# Patient Record
Sex: Female | Born: 1955 | Race: White | Hispanic: No | Marital: Married | Smoking: Never smoker
Health system: Southern US, Community
[De-identification: ages and names within clinical notes are randomized; demographics above are authoritative.]

## PROBLEM LIST (undated history)

## (undated) DIAGNOSIS — I10 Essential (primary) hypertension: Secondary | ICD-10-CM

## (undated) DIAGNOSIS — E039 Hypothyroidism, unspecified: Secondary | ICD-10-CM

## (undated) DIAGNOSIS — K219 Gastro-esophageal reflux disease without esophagitis: Secondary | ICD-10-CM

## (undated) DIAGNOSIS — E785 Hyperlipidemia, unspecified: Secondary | ICD-10-CM

## (undated) DIAGNOSIS — Z8616 Personal history of COVID-19: Secondary | ICD-10-CM

## (undated) DIAGNOSIS — H269 Unspecified cataract: Secondary | ICD-10-CM

## (undated) DIAGNOSIS — M810 Age-related osteoporosis without current pathological fracture: Secondary | ICD-10-CM

## (undated) DIAGNOSIS — T7840XA Allergy, unspecified, initial encounter: Secondary | ICD-10-CM

## (undated) DIAGNOSIS — J45909 Unspecified asthma, uncomplicated: Secondary | ICD-10-CM

## (undated) DIAGNOSIS — R0981 Nasal congestion: Secondary | ICD-10-CM

## (undated) HISTORY — DX: Essential (primary) hypertension: I10

## (undated) HISTORY — DX: Nasal congestion: R09.81

## (undated) HISTORY — PX: OTHER SURGICAL HISTORY: SHX169

## (undated) HISTORY — DX: Hyperlipidemia, unspecified: E78.5

## (undated) HISTORY — PX: CHOLECYSTECTOMY: SHX55

## (undated) HISTORY — DX: Personal history of COVID-19: Z86.16

## (undated) HISTORY — PX: GALLBLADDER SURGERY: SHX652

## (undated) HISTORY — DX: Unspecified cataract: H26.9

## (undated) HISTORY — DX: Age-related osteoporosis without current pathological fracture: M81.0

## (undated) HISTORY — PX: ABDOMINAL HYSTERECTOMY: SHX81

## (undated) HISTORY — PX: LAPAROSCOPIC HYSTERECTOMY: SHX1926

## (undated) HISTORY — DX: Allergy, unspecified, initial encounter: T78.40XA

## (undated) HISTORY — PX: DUPUYTREN CONTRACTURE RELEASE: SHX1478

---

## 1997-05-23 ENCOUNTER — Emergency Department (HOSPITAL_COMMUNITY): Admission: RE | Admit: 1997-05-23 | Discharge: 1997-05-23 | Payer: Self-pay | Admitting: Emergency Medicine

## 1997-06-18 ENCOUNTER — Encounter (HOSPITAL_COMMUNITY): Admission: RE | Admit: 1997-06-18 | Discharge: 1997-09-16 | Payer: Self-pay | Admitting: *Deleted

## 1997-09-16 ENCOUNTER — Other Ambulatory Visit: Admission: RE | Admit: 1997-09-16 | Discharge: 1997-09-16 | Payer: Self-pay | Admitting: Obstetrics and Gynecology

## 1997-10-27 ENCOUNTER — Ambulatory Visit (HOSPITAL_COMMUNITY): Admission: RE | Admit: 1997-10-27 | Discharge: 1997-10-27 | Payer: Self-pay | Admitting: Obstetrics and Gynecology

## 1997-11-24 ENCOUNTER — Encounter: Admission: RE | Admit: 1997-11-24 | Discharge: 1998-02-22 | Payer: Self-pay | Admitting: *Deleted

## 1998-05-10 ENCOUNTER — Emergency Department (HOSPITAL_COMMUNITY): Admission: EM | Admit: 1998-05-10 | Discharge: 1998-05-10 | Payer: Self-pay

## 1998-05-10 ENCOUNTER — Encounter: Payer: Self-pay | Admitting: Emergency Medicine

## 1998-05-14 ENCOUNTER — Ambulatory Visit (HOSPITAL_COMMUNITY): Admission: RE | Admit: 1998-05-14 | Discharge: 1998-05-14 | Payer: Self-pay | Admitting: Internal Medicine

## 1998-05-14 ENCOUNTER — Encounter: Payer: Self-pay | Admitting: Internal Medicine

## 1998-09-01 ENCOUNTER — Other Ambulatory Visit: Admission: RE | Admit: 1998-09-01 | Discharge: 1998-09-01 | Payer: Self-pay | Admitting: Obstetrics and Gynecology

## 1998-10-06 ENCOUNTER — Encounter (INDEPENDENT_AMBULATORY_CARE_PROVIDER_SITE_OTHER): Payer: Self-pay | Admitting: Specialist

## 1998-10-06 ENCOUNTER — Inpatient Hospital Stay (HOSPITAL_COMMUNITY): Admission: RE | Admit: 1998-10-06 | Discharge: 1998-10-08 | Payer: Self-pay | Admitting: Gynecology

## 1999-03-22 ENCOUNTER — Emergency Department (HOSPITAL_COMMUNITY): Admission: EM | Admit: 1999-03-22 | Discharge: 1999-03-22 | Payer: Self-pay | Admitting: Emergency Medicine

## 1999-03-22 ENCOUNTER — Encounter: Payer: Self-pay | Admitting: Emergency Medicine

## 1999-10-25 ENCOUNTER — Other Ambulatory Visit: Admission: RE | Admit: 1999-10-25 | Discharge: 1999-10-25 | Payer: Self-pay | Admitting: Obstetrics and Gynecology

## 2000-02-29 ENCOUNTER — Encounter (INDEPENDENT_AMBULATORY_CARE_PROVIDER_SITE_OTHER): Payer: Self-pay | Admitting: *Deleted

## 2000-02-29 ENCOUNTER — Ambulatory Visit (HOSPITAL_COMMUNITY): Admission: RE | Admit: 2000-02-29 | Discharge: 2000-02-29 | Payer: Self-pay | Admitting: Internal Medicine

## 2000-02-29 ENCOUNTER — Encounter: Payer: Self-pay | Admitting: Internal Medicine

## 2000-03-02 ENCOUNTER — Encounter: Payer: Self-pay | Admitting: Internal Medicine

## 2000-05-22 ENCOUNTER — Encounter: Payer: Self-pay | Admitting: General Surgery

## 2000-05-22 ENCOUNTER — Ambulatory Visit (HOSPITAL_COMMUNITY): Admission: RE | Admit: 2000-05-22 | Discharge: 2000-05-22 | Payer: Self-pay | Admitting: General Surgery

## 2000-07-22 ENCOUNTER — Encounter (INDEPENDENT_AMBULATORY_CARE_PROVIDER_SITE_OTHER): Payer: Self-pay | Admitting: *Deleted

## 2000-08-15 ENCOUNTER — Encounter: Payer: Self-pay | Admitting: General Surgery

## 2000-08-17 ENCOUNTER — Observation Stay (HOSPITAL_COMMUNITY): Admission: RE | Admit: 2000-08-17 | Discharge: 2000-08-18 | Payer: Self-pay | Admitting: General Surgery

## 2000-08-17 ENCOUNTER — Encounter (INDEPENDENT_AMBULATORY_CARE_PROVIDER_SITE_OTHER): Payer: Self-pay | Admitting: Specialist

## 2000-08-17 ENCOUNTER — Encounter (INDEPENDENT_AMBULATORY_CARE_PROVIDER_SITE_OTHER): Payer: Self-pay | Admitting: *Deleted

## 2000-08-17 ENCOUNTER — Encounter: Payer: Self-pay | Admitting: General Surgery

## 2000-10-24 ENCOUNTER — Other Ambulatory Visit: Admission: RE | Admit: 2000-10-24 | Discharge: 2000-10-24 | Payer: Self-pay | Admitting: Obstetrics and Gynecology

## 2001-10-09 ENCOUNTER — Encounter: Payer: Self-pay | Admitting: Internal Medicine

## 2001-10-14 ENCOUNTER — Encounter: Payer: Self-pay | Admitting: Internal Medicine

## 2001-10-14 ENCOUNTER — Ambulatory Visit (HOSPITAL_COMMUNITY): Admission: RE | Admit: 2001-10-14 | Discharge: 2001-10-14 | Payer: Self-pay | Admitting: Internal Medicine

## 2001-12-27 ENCOUNTER — Other Ambulatory Visit: Admission: RE | Admit: 2001-12-27 | Discharge: 2001-12-27 | Payer: Self-pay | Admitting: Gynecology

## 2003-03-02 ENCOUNTER — Other Ambulatory Visit: Admission: RE | Admit: 2003-03-02 | Discharge: 2003-03-02 | Payer: Self-pay | Admitting: Gynecology

## 2004-03-10 ENCOUNTER — Other Ambulatory Visit: Admission: RE | Admit: 2004-03-10 | Discharge: 2004-03-10 | Payer: Self-pay | Admitting: Gynecology

## 2004-10-04 ENCOUNTER — Encounter: Admission: RE | Admit: 2004-10-04 | Discharge: 2004-11-07 | Payer: Self-pay | Admitting: Orthopedic Surgery

## 2004-10-26 ENCOUNTER — Ambulatory Visit (HOSPITAL_BASED_OUTPATIENT_CLINIC_OR_DEPARTMENT_OTHER): Admission: RE | Admit: 2004-10-26 | Discharge: 2004-10-26 | Payer: Self-pay | Admitting: Orthopedic Surgery

## 2004-10-26 ENCOUNTER — Ambulatory Visit (HOSPITAL_COMMUNITY): Admission: RE | Admit: 2004-10-26 | Discharge: 2004-10-26 | Payer: Self-pay | Admitting: Orthopedic Surgery

## 2004-11-07 ENCOUNTER — Encounter: Admission: RE | Admit: 2004-11-07 | Discharge: 2005-02-05 | Payer: Self-pay | Admitting: Orthopedic Surgery

## 2005-03-15 ENCOUNTER — Other Ambulatory Visit: Admission: RE | Admit: 2005-03-15 | Discharge: 2005-03-15 | Payer: Self-pay | Admitting: Obstetrics and Gynecology

## 2006-02-07 ENCOUNTER — Ambulatory Visit: Payer: Self-pay | Admitting: Internal Medicine

## 2006-02-26 ENCOUNTER — Ambulatory Visit: Payer: Self-pay | Admitting: Internal Medicine

## 2006-03-16 ENCOUNTER — Other Ambulatory Visit: Admission: RE | Admit: 2006-03-16 | Discharge: 2006-03-16 | Payer: Self-pay | Admitting: Obstetrics and Gynecology

## 2007-03-19 ENCOUNTER — Other Ambulatory Visit: Admission: RE | Admit: 2007-03-19 | Discharge: 2007-03-19 | Payer: Self-pay | Admitting: Obstetrics and Gynecology

## 2008-03-23 ENCOUNTER — Other Ambulatory Visit: Admission: RE | Admit: 2008-03-23 | Discharge: 2008-03-23 | Payer: Self-pay | Admitting: Obstetrics and Gynecology

## 2008-10-21 ENCOUNTER — Ambulatory Visit (HOSPITAL_COMMUNITY): Admission: RE | Admit: 2008-10-21 | Discharge: 2008-10-21 | Payer: Self-pay | Admitting: General Surgery

## 2008-10-21 ENCOUNTER — Encounter (INDEPENDENT_AMBULATORY_CARE_PROVIDER_SITE_OTHER): Payer: Self-pay | Admitting: *Deleted

## 2009-03-09 ENCOUNTER — Ambulatory Visit: Payer: Self-pay | Admitting: Internal Medicine

## 2009-03-09 DIAGNOSIS — K589 Irritable bowel syndrome without diarrhea: Secondary | ICD-10-CM

## 2009-03-09 DIAGNOSIS — R1310 Dysphagia, unspecified: Secondary | ICD-10-CM | POA: Insufficient documentation

## 2009-03-09 DIAGNOSIS — K219 Gastro-esophageal reflux disease without esophagitis: Secondary | ICD-10-CM | POA: Insufficient documentation

## 2009-03-26 ENCOUNTER — Ambulatory Visit: Payer: Self-pay | Admitting: Internal Medicine

## 2009-03-30 ENCOUNTER — Encounter: Payer: Self-pay | Admitting: Internal Medicine

## 2009-04-01 ENCOUNTER — Telehealth: Payer: Self-pay | Admitting: Internal Medicine

## 2009-04-01 DIAGNOSIS — R109 Unspecified abdominal pain: Secondary | ICD-10-CM | POA: Insufficient documentation

## 2009-04-05 ENCOUNTER — Ambulatory Visit (HOSPITAL_COMMUNITY): Admission: RE | Admit: 2009-04-05 | Discharge: 2009-04-05 | Payer: Self-pay | Admitting: Internal Medicine

## 2009-04-07 ENCOUNTER — Telehealth: Payer: Self-pay | Admitting: Internal Medicine

## 2009-07-07 ENCOUNTER — Other Ambulatory Visit: Admission: RE | Admit: 2009-07-07 | Discharge: 2009-07-07 | Payer: Self-pay | Admitting: Obstetrics and Gynecology

## 2010-04-21 NOTE — Progress Notes (Signed)
Summary: Triage / still with symptoms  Phone Note Call from Patient Call back at [phone]   Caller: Patient Call For: Dr. Marina Goodell Reason for Call: Talk to Nurse Summary of Call: Pt had a EGD last week and is having  pain in her Esophagus and abdominal pain Initial call taken by: Karna Christmas,  April 01, 2009 10:35 AM  Follow-up for Phone Call        Pt. says her abd.pain is worse.Describes pain in epigastric area that radiates to her back and is worse after belching says it is the same pain as that prior to EGD.Says she is taking Dexilant as prescribed. Follow-up by: Teryl Lucy RN,  April 01, 2009 11:23 AM  Additional Follow-up for Phone Call Additional follow up Details #1::        please tell her that I am not sure why she is having symptoms. There was nothing overwhelming on her endoscopy. One await order an abdominal ultrasound to further evaluate this. Also, check with her to make sure that she is not having issues with anxiety or nerves. She has had this in the past which accounted for similar symptoms. Thanks Additional Follow-up by: Hilarie Fredrickson MD,  April 01, 2009 12:02 PM  New Problems: ABDOMINAL PAIN (ICD-789.00)   New Problems: ABDOMINAL PAIN (ICD-789.00)  Appended Document: Triage / still with symptoms Pt. scheduled for [place] of abd. at [facility] on 04/05/2009 at 11 a.m.Pt. ntfd. and told she should be NPO for 6 hrs. prior to test.(5 a.m.)

## 2010-04-21 NOTE — Letter (Signed)
Summary: Patient Notice-Endo Biopsy Results  [facility]  [address] [place], [place] [address]   Phone: [phone]  Fax: [phone]        March 30, 2009 MRN: [id number]    MARYJAYNE KLEVEN [address] [place], [place]  [address]    Dear Rosey Bath,  I am pleased to inform you that the biopsies taken during your recent endoscopic examination revealed a simple benign stomach polyp, as expected.    Additional information/recommendations:    No further action is needed at this time.  Please follow-up with      your primary care physician for your other healthcare needs.    Please call us if you are having persistent problems or have questions about your condition that have not been fully answered at this time.  My best to you and Tomma Lightning!  Sincerely,  Hilarie Fredrickson MD  This letter has been electronically signed by your physician.  Appended Document: Patient Notice-Endo Biopsy Results Letter mailed 01.13.11

## 2010-04-21 NOTE — Miscellaneous (Signed)
Summary: samples of dexilant  Clinical Lists Changes

## 2010-04-21 NOTE — Progress Notes (Signed)
Summary: Check to see if patient had Ultrasound  ---- Converted from flag ---- ---- 04/01/2009 4:43 PM, Teryl Lucy RN wrote:  Check to see if Sara House(08-27-2055) had [place] of abd. done thatwas sceduled at [facility] on 04/05/09. ------------------------------  Phone Note Outgoing Call Call back at [facility] Phone [phone]   Call placed by: Milford Cage NCMA,  April 07, 2009 8:45 AM Summary of Call: Checked to see if patient had Ultrasound in EMR and she did.   Initial call taken by: Milford Cage NCMA,  April 07, 2009 8:45 AM

## 2010-04-21 NOTE — Procedures (Signed)
Summary: Upper Endoscopy  Patient: [name] Note: All result statuses are Final unless otherwise noted.  Tests: (1) Upper Endoscopy (EGD)   EGD Upper Endoscopy       DONE     [facility]     [address]     [place], [place]  [address]           ENDOSCOPY PROCEDURE REPORT           PATIENT:  Sara House, Sara House  MR#:  [id number]     BIRTHDATE:  May 09, 1955, 53 yrs. old  GENDER:  female           ENDOSCOPIST:  Wilhemina Bonito. Eda Keys, MD     Referred by:  Office           PROCEDURE DATE:  03/26/2009     PROCEDURE:  EGD with biopsy, Elease Hashimoto Dilation of Esophagus -70F     ASA CLASS:  Class II     INDICATIONS:  GERD, dysphagia           MEDICATIONS:   Fentanyl 75 mcg IV, Versed 7 mg IV     TOPICAL ANESTHETIC:  Exactacain Spray           DESCRIPTION OF PROCEDURE:   After the risks benefits and     alternatives of the procedure were thoroughly explained, informed     consent was obtained.  The LB GIF-H180 [id number] endoscope was     introduced through the mouth and advanced to the second portion of     the duodenum, without limitations.  The instrument was slowly     withdrawn as the mucosa was fully examined.     <<PROCEDUREIMAGES>>           The upper, middle, and distal third of the esophagus were     carefully inspected and no abnormalities were noted. The z-line     was well seen at the GEJ. The endoscope was pushed into the fundus     which was normal including a retroflexed view. The antrum,gastric     body, first and second part of the duodenum were unremarkable     except for multiple benign appearring polyps identified in the     body of the stomach. Bx taken.    Retroflexed views revealed no     abnormalities.    The scope was then withdrawn from the patient     and the procedure completed.           THERAPY: EMPIRIC DILATION OF THE ESOPHAGUS WITH 70F MALONEY     DILATOR W/O RESISTANCE OR HEME . TOLERATED WELL           COMPLICATIONS:  None           ENDOSCOPIC  IMPRESSION:     1) Normal EGD - S/P ESOPHAGEAL DILATION FOR DYSPHAGIA     2) Polyps, multiple in the body of the stomach - BX     RECOMMENDATIONS:     1) anti-reflux regimen to be follow     2) Clear liquids until 6PM, then soft foods rest of day. Resume     prior diet tomorrow.     3) continue current medications           ______________________________     Wilhemina Bonito. Eda Keys, MD           CC:  Marjory Lies, MD, The Patient  n.     eSIGNED:   Wilhemina Bonito. Eda Keys at 03/26/2009 04:54 PM           Ilda Foil, [id number]  Note: An exclamation mark (!) indicates a result that was not dispersed into the flowsheet. Document Creation Date: 03/26/2009 4:53 PM _______________________________________________________________________  (1) Order result status: Final Collection or observation date-time: 03/26/2009 16:42 Requested date-time:  Receipt date-time:  Reported date-time:  Referring Physician:   Ordering Physician: Fransico Setters [phone]) Specimen Source:  Source: Launa Grill Order Number: [id number] Lab site:

## 2010-06-26 LAB — BASIC METABOLIC PANEL
Calcium: 9.7 mg/dL (ref 8.4–10.5)
Creatinine, Ser: 0.75 mg/dL (ref 0.4–1.2)
GFR calc Af Amer: >60 mL/min (ref >60)
Sodium: 140 mEq/L (ref 135–145)

## 2010-06-26 LAB — DIFFERENTIAL
Eosinophils Absolute: 0.1 10*3/uL (ref 0.0–0.7)
Neutro Abs: 3.3 10*3/uL (ref 1.7–7.7)
Neutrophils Relative %: 53 % (ref 43–77)

## 2010-06-26 LAB — CBC
MCHC: 32.8 g/dL (ref 30.0–36.0)
MCV: 88.3 fL (ref 78.0–100.0)
Platelets: 201 10*3/uL (ref 150–400)
RBC: 4.83 MIL/uL (ref 3.87–5.11)

## 2010-08-02 NOTE — Op Note (Signed)
NAMEPATRECIA, Sara House                 ACCOUNT NO.:  [id number]   MEDICAL RECORD NO.:  [id number]          PATIENT TYPE:  AMB   LOCATION:  DAY                          FACILITY:  [facility]   PHYSICIAN:  Sharlet Salina T. Hoxworth, M.D.DATE OF BIRTH:  09/02/1955   DATE OF PROCEDURE:  10/21/2008  DATE OF DISCHARGE:                               OPERATIVE REPORT   PREOPERATIVE DIAGNOSIS:  Chronic anal fissure.   POSTOPERATIVE DIAGNOSIS:  Chronic anal fissure.   SURGICAL PROCEDURE:  Lateral internal anal sphincterotomy.   ANESTHESIA:  General.   BRIEF HISTORY:  Sara House is a 55 year old female who has had recurring  problems with an anal fissure over about a year.  This has temporarily  responded to medications at times but now she has persistence of rectal  pain and bleeding with bowel movements and has a posterior midline  fissure confirmed on exam.  We have recommended proceeding with anal  sphincterotomy.  The nature of the procedure, indications, risks of  bleeding, infection, nonhealing and degrees of incontinence have been  discussed and are understood.  The patient is now brought to the  operating room for this procedure.   DESCRIPTION OF OPERATION:  The patient was brought to the operating room  and placed in the supine position on the operating table and general  endotracheal anesthesia was induced.  She was then carefully positioned  in the padded lithotomy position.  The perineum  was sterilely prepped  and draped.  The correct patient and procedure were verified.  Anoscopy  revealed a very taut, hypertrophied internal anal sphincter and a  chronic posterior fissure with associated skin tag.  The  intersphincteric groove was located in the right lateral position.  The  small stab incision made.  Hemostat was inserted into the  intersphincteric groove and the hypertrophied portion of the internal  anal sphincter elevated up into the wound and divided with cautery with  good relaxation  back to a normal state.  Hemostasis was obtained with  pressure and cautery.  A small associated skin tag was excised.  A  perianal block of Marcaine with epinephrine was performed.  Hemostasis  was assured.  The patient was then taken to recovery in good condition.      Lorne Skeens. Hoxworth, M.D.  Electronically Signed     BTH/MEDQ  D:  10/21/2008  T:  10/21/2008  Job:  [id number]

## 2010-08-05 NOTE — Op Note (Signed)
[facility]  Patient:    IDALYS, KONECNY                          MRN: [id number]. Date: 08/17/00 Attending:  Sharlet Salina T. Hoxworth, M.D.                           Operative Report  PREOPERATIVE DIAGNOSIS:  Biliary dyskinesia.  POSTOPERATIVE DIAGNOSIS:  Biliary dyskinesia.  SURGICAL PROCEDURE:  Laparoscopic cholecystectomy with intraoperative cholangiogram.  SURGEON:  Sharlet Salina T. Hoxworth, M.D.  ANESTHESIA:  General.  BRIEF HISTORY:  Ms. Pesci is a 55 year old white female with a history of persistent and worsening episodic epigastric and right upper quadrant abdominal pain.  She has had a thorough negative work-up.  Her symptoms are typical for biliary colic, and it was felt clinically that she may well have biliary dyskinesia.  Options for management had been discussed extensively with the patient and her husband, and we now have elected to proceed with laparoscopic cholecystectomy with cholangiogram in an attempt to relieve her symptoms.  The nature of the procedure, its indications, risks of bleeding, infection, bile leak, embolic injury were discussed and understood.  She is now brought to the operating room for this procedure.  DESCRIPTION OF PROCEDURE:  The patient was brought to the operating room, placed in the supine position on the operating table, and general endotracheal anesthesia was induced.  She was given antibiotics preoperatively.  The abdomen was sterilely prepped and draped. PAS were in place.  Local anesthesia was used to infiltrate the trocar sites prior to the incisions.  A 1 cm incision was made at the umbilicus and dissection carried down to the midline fascia.  This was sharply incised for 1 cm and the peritoneum entered under direct vision.  Through a mattress suture of 0 Vicryl, the Hasson trocar was placed and pneumoperitoneum established.  Under direct vision, a 10 mm trocar was placed in the subxiphoid area, and two  5 mm trocars along the right subcostal margin.  The fundus was visualized and grasped and elevated up over the liver and infundibulum retracted inferolaterally.  Fibrofatty tissue was stripped off at the neck of the gallbladder toward the porta hepatis.  Calots triangle was thoroughly dissected.  The distal gallbladder was thoroughly dissected.  The anterior branch of the cystic artery was seen coursing up on the gallbladder wall and was divided between two proximal and one distal clips.  The cystic duct was then dissected over about 1 cm and clipped at its junction with the gallbladder.  Operative cholangiograms were obtained through the cystic duct which good filling of a normal sized common bile duct and common hepatic duct with good flow into the liver and in the duodenum with no filling defects or obstruction.  Following this, the cystic duct was triply clipped proximally and divided.  The gallbladder was then dissected free from its bed using hook and spatula cautery.  The posterior branch of the cystic artery was divided between clips.  The gallbladder was detached and removed through the umbilicus.  The right upper quadrant was thoroughly irrigated and complete hemostasis assured.  There was some bleeding from one of the lateral 5 mm trocar sites, and an endoclose 0 Vicryl suture was used to control this. There was no further bleeding.  All CO2 was evacuated from the peritoneal cavity and all of the trocars removed.  The pursestring suture was  secured at the umbilicus.  Skin incisions were closed with interrupted subcuticular 4-0 Monocryl and Steri-Strips.  Sponge, needle and instrument counts were correct. Dry sterile dressings were applied, and the patient was taken to the recovery in good condition. DD:  08/17/00 TD:  08/17/00 Job: [id number]

## 2010-08-05 NOTE — Op Note (Signed)
NAMEJORDAIN, RADIN                 ACCOUNT NO.:  [id number]   MEDICAL RECORD NO.:  [id number]          PATIENT TYPE:  AMB   LOCATION:  DSC                          FACILITY:  [facility]   PHYSICIAN:  Mila Homer. Sherlean Foot, M.D. DATE OF BIRTH:  25-Apr-1955   DATE OF PROCEDURE:  10/26/2004  DATE OF DISCHARGE:                                 OPERATIVE REPORT   PREOPERATIVE DIAGNOSIS:  Left shoulder impingement syndrome.   POSTOPERATIVE DIAGNOSIS:  Left shoulder impingement syndrome.   OPERATION PERFORMED:  Left shoulder arthroscopy, subacromial decompression,  distal clavicle resection.   SURGEON:  Mila Homer. Sherlean Foot, M.D.   INDICATIONS FOR PROCEDURE:  The patient is a 55 year old white female with  failure of conservative measures for shoulder impingement syndrome.  Informed consent was obtained.   DESCRIPTION OF PROCEDURE:  The patient was laid supine and administered  general anesthesia and placed in beach chair position.  The left shoulder  was prepped and draped in the usual sterile fashion.  Anterior, posterior  and direct lateral portals were created with a #11 blade, blunt trocar and  cannula.  Diagnostic arthroscopy revealed some minor degenerative tearing of  the labrum.  There was also some synovitis.  I entered through the anterior  portal.  A [place] shaver was placed and a synovectomy was performed as  well as debridement of the labrum.  I then went from the posterior portal  into the subacromial space.  I then performed bursectomy with the [employer] shaver.  Then there was incredible impingement with very, very little  space for the rotator cuff.  I used a 4.0 mm cylindrical bur to open up the  space and perform an anterior lateral acromioplasty.  This worked  exceptionally well.  I decompressed it quite a bit.  I also performed a  distal clavicle resection arthroscopically as well through the anterior and  direct lateral portals.  I then released the CA ligament and  further  completed my decompression.  I then lavaged and closed with interrupted 4-0  nylon sutures, dressed with Adaptic, 4 x 4s, ABDs and 2 inch silk tape and a  simple sling.   COMPLICATIONS:  None.   DRAINS:  None.       SDL/MEDQ  D:  10/26/2004  T:  10/26/2004  Job:  [id number]

## 2010-08-05 NOTE — Assessment & Plan Note (Signed)
[facility]                           GASTROENTEROLOGY OFFICE NOTE   NAME:Sara House, Sara House                MRN:          [id number]  DATE:02/07/2006                            DOB:          1955/06/23    The patient is self referred.   REASON FOR CONSULTATION:  Itching, burning, uncomfortable perirectal buttock  skin and chronic lower abdominal complaints for colonoscopy.   HISTORY:  This is a 55 year old white female with a history of hypertension,  hyperlipidemia, hypothyroidism, functional complaints, and prior  cholecystectomy who presents today regarding the above listed complaints.  With regards to the perirectal skin issue, she has been treated with  Triamcinolone cream without improvement.  She has not seen a dermatologist.  She continues to take Nexium for reflux disease.  Overall doing well without  symptoms except for rare nausea.  She continues with and has had for many  years lower abdominal cramping discomfort associated with loose stools.  Over the past month she has seen some blood per rectum.  She has been  diagnosed previously with irritable bowel and has seen Dr. Jena Gauss  (gastroenterologist in [place]).  She has had no weight loss.   PAST MEDICAL HISTORY:  Hypertension, hyperlipidemia, hypothyroidism,  anxiety, gastroesophageal reflux disease.   PAST SURGICAL HISTORY:  1. Hemorrhoidectomy.  2. Cholecystectomy.  3. Hysterectomy.   CURRENT MEDICATIONS:  1. Levothyroxine 50 mcg daily.  2. Nexium 40 mg b.i.d.  3. Propranolol 80 mg daily.  4. Vivella dot 75 mcg.  5. Fish oil, calcium, vitamin D, stool softeners.   ALLERGIES:  PENICILLIN.   FAMILY HISTORY:  Negative for gastrointestinal malignancy.   SOCIAL HISTORY:  The patient is married, without children, is accompanied by  her husband, Tomma Lightning, has a 12th grade education, works as a Engineer, site for [employer].  She does not smoke or use  alcohol.   REVIEW OF SYSTEMS:  Per diagnostic evaluation form.   PHYSICAL EXAMINATION:  Well appearing female in no acute distress.  Blood  pressure 150/92, heart rate is 72 and regular, weight is 166 pounds.  HEENT:  Sclerae are anicteric, oral mucosa is intact, no adenopathy.  LUNGS:  Clear.  HEART:  Regular.  ABDOMEN:  Soft without significant tenderness, mass or hernia.  Good bowel  sounds heard.  PERIRECTAL EXAM:  Reveals cracked skin and erythema up the center of the  buttock.  Also some redness of the skin around the anus on the buttock.  No  other external problems such as hemorrhoids.  EXTREMITIES:  Without edema.   IMPRESSION:  1. Buttock skin irritation with breakdown.  No response to steroid cream.      May be a fungal infection.  2. Chronic lower abdominal complaints consistent with irritable bowel      syndrome.  3. Gastroesophageal reflux disease, stable.  4. Screening colonoscopy.  Appropriate candidate without contraindication.   RECOMMENDATIONS:  1. Continue Nexium.  2. Spectazole cream for buttock skin problems.  3. Schedule colonoscopy with polypectomy if necessary.  Ashby Dawes of the      procedure as well as the risks, benefits, and alternatives  have been      reviewed.  She understood and agreed to proceed.  4. Ongoing general medical care with Dr. Caryl Never.     Wilhemina Bonito. Eda Keys., MD  Electronically Signed    JNP/MedQ  DD: 02/07/2006  DT: 02/07/2006  Job #: [id number]   cc:   Evelena Peat, M.D.

## 2010-09-13 ENCOUNTER — Other Ambulatory Visit: Payer: Self-pay | Admitting: Obstetrics and Gynecology

## 2010-09-13 ENCOUNTER — Other Ambulatory Visit (HOSPITAL_COMMUNITY)
Admission: RE | Admit: 2010-09-13 | Discharge: 2010-09-13 | Disposition: A | Discharge status: Home / Self Care | Payer: BC Managed Care – PPO | Source: Ambulatory Visit | Attending: Obstetrics and Gynecology | Admitting: Obstetrics and Gynecology

## 2010-09-13 DIAGNOSIS — Z01419 Encounter for gynecological examination (general) (routine) without abnormal findings: Secondary | ICD-10-CM | POA: Insufficient documentation

## 2011-02-14 IMAGING — US US ABDOMEN COMPLETE
1 series · 14 of 25 positions shown · non-contrast
Comparison: None.

CLINICAL DATA: Abdominal pain

COMPLETE ABDOMINAL ULTRASOUND

[Series 1: us abdomen complete · 0.35mm/px · 14 of 63 slices shown]
[im 1/63]
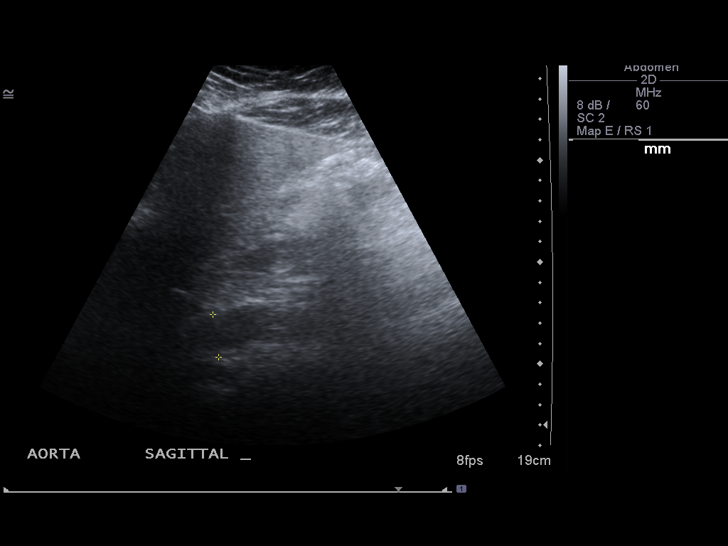
[im 6/63]
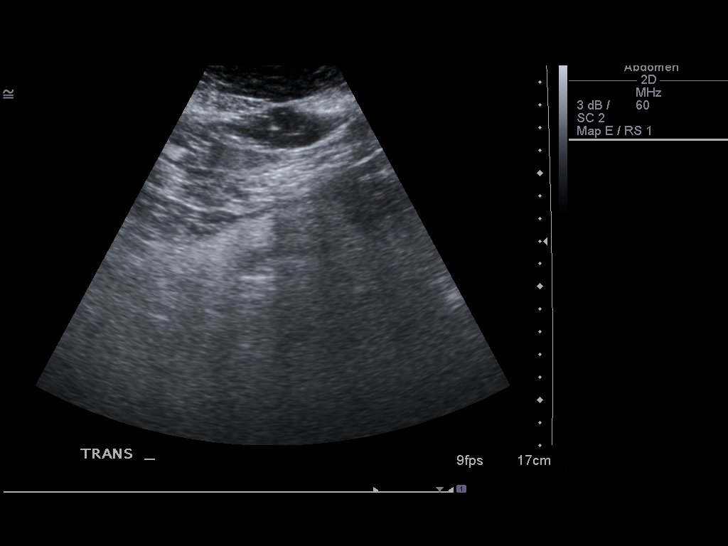
[im 11/63]
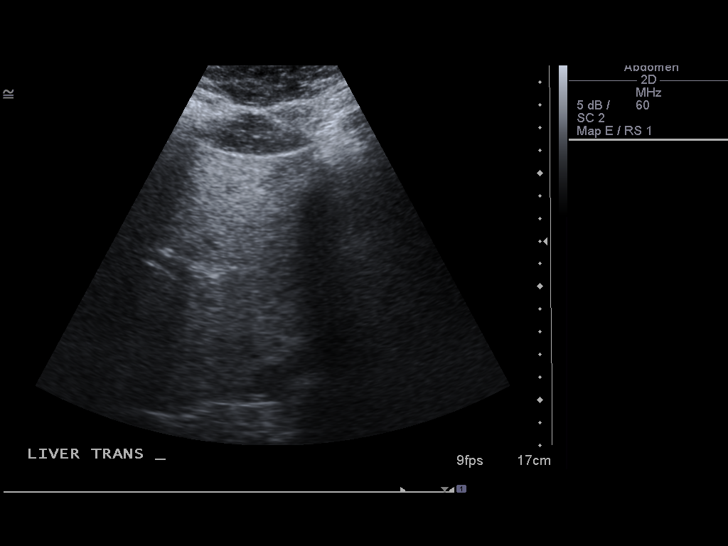
[im 16/63]
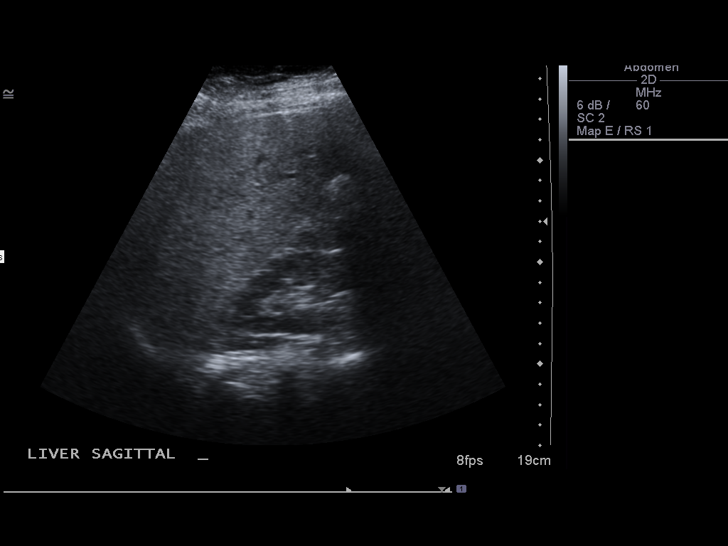
[im 21/63]
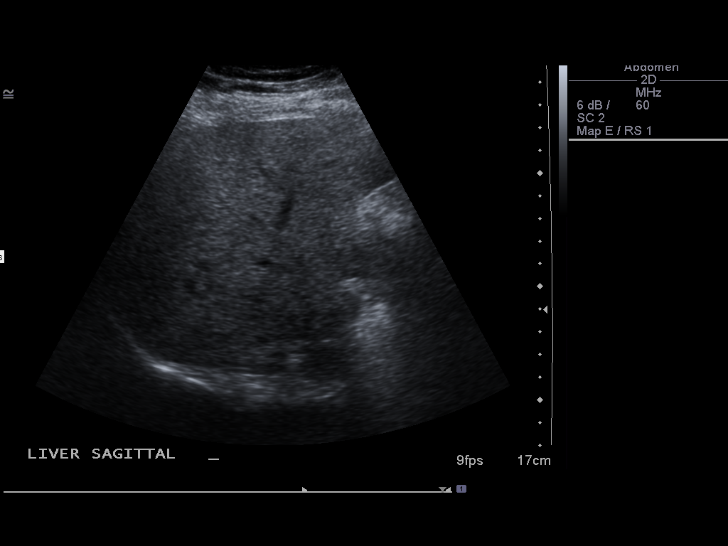
[im 24/63]
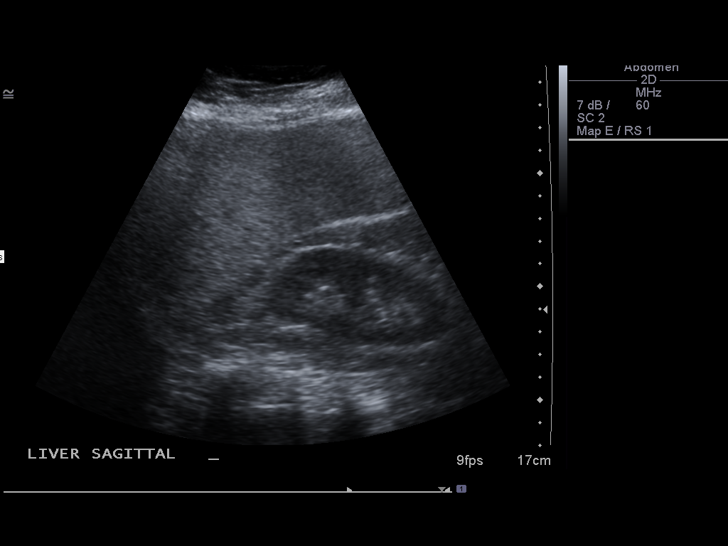
[im 29/63]
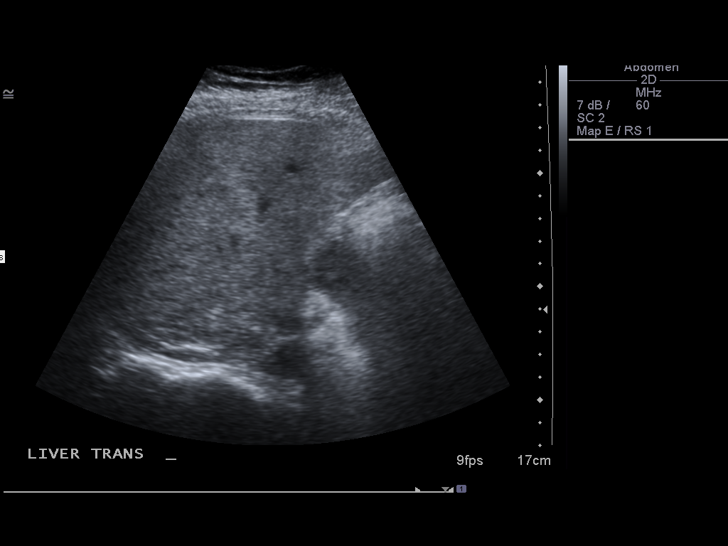
[im 34/63]
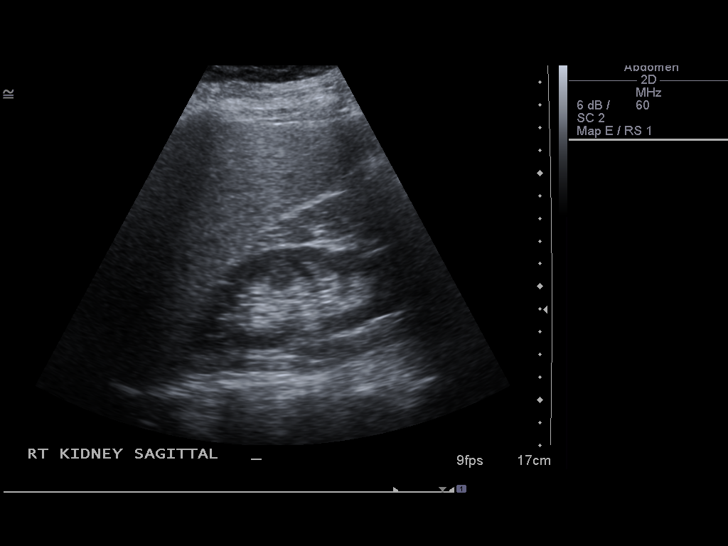
[im 39/63]
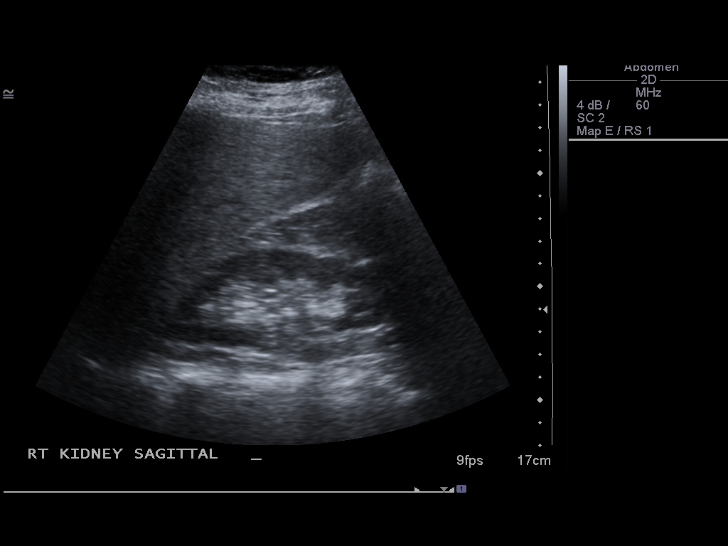
[im 42/63]
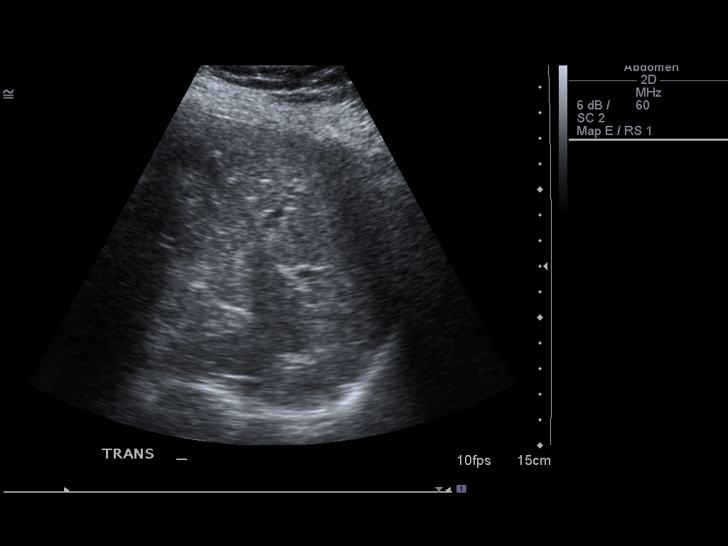
[im 47/63]
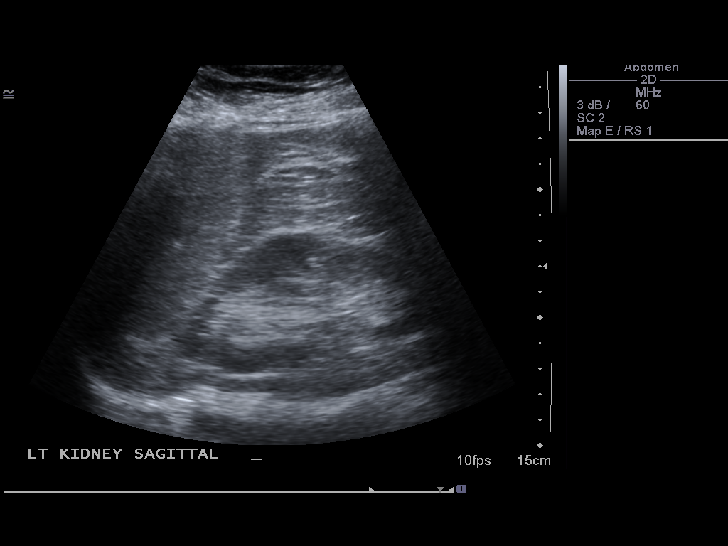
[im 52/63]
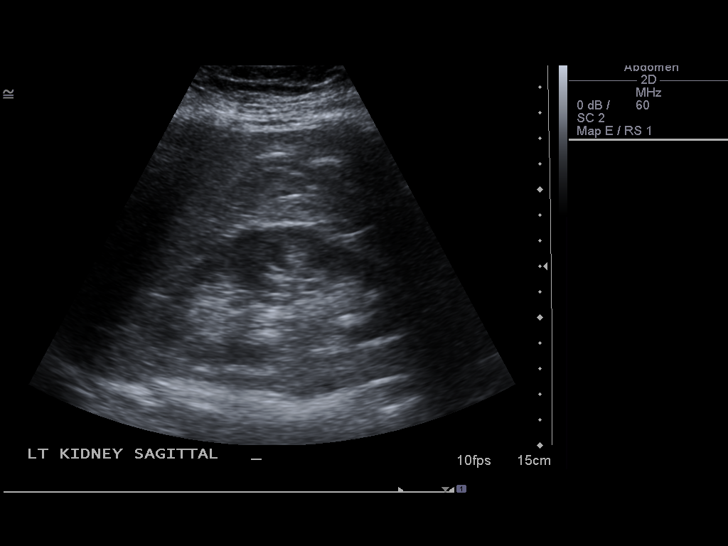
[im 57/63]
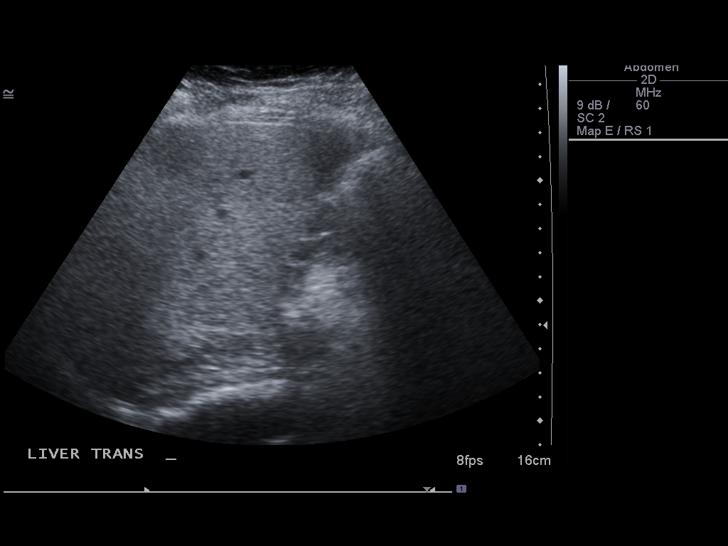
[im 63/63]
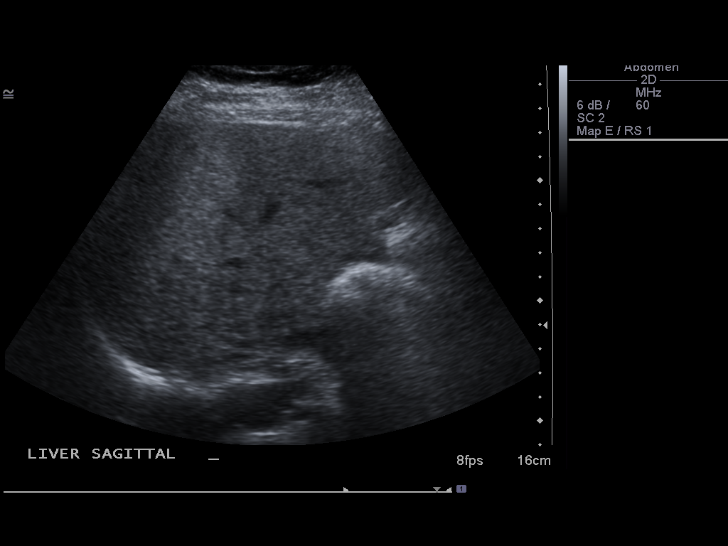

[14 of 25 positions shown; findings below may reference images not displayed]

FINDINGS: Gallbladder:  Surgically absent

Common bile duct:  Within normal limits measures 5.3 mm in diameter

Liver:  No focal lesion identified.  Within normal limits in
parenchymal echogenicity.

IVC:  Appears normal.

Pancreas:  No focal abnormality seen.

Spleen:  Measures 6.7 cm in length.  Normal echogenicity

Right Kidney:  Measures 10.9 cm in length.  No hydronephrosis or
diagnostic renal calculus

Left Kidney:  Measures 10.6 cm in length.  No hydronephrosis or
diagnostic renal calculus

Abdominal aorta:  No aneurysm identified. Measures up to 2.1 cm in
diameter.
IMPRESSION: Negative abdominal ultrasound. Surgically absent gallbladder.
Normal CBD.

## 2012-09-19 ENCOUNTER — Other Ambulatory Visit: Payer: Self-pay | Admitting: Obstetrics and Gynecology

## 2012-09-19 ENCOUNTER — Other Ambulatory Visit (HOSPITAL_COMMUNITY)
Admission: RE | Admit: 2012-09-19 | Discharge: 2012-09-19 | Disposition: A | Discharge status: Home / Self Care | Payer: BC Managed Care – PPO | Source: Ambulatory Visit | Attending: Obstetrics and Gynecology | Admitting: Obstetrics and Gynecology

## 2012-09-19 DIAGNOSIS — Z1151 Encounter for screening for human papillomavirus (HPV): Secondary | ICD-10-CM | POA: Insufficient documentation

## 2012-09-19 DIAGNOSIS — Z01419 Encounter for gynecological examination (general) (routine) without abnormal findings: Secondary | ICD-10-CM | POA: Insufficient documentation

## 2013-10-24 ENCOUNTER — Encounter: Payer: Self-pay | Admitting: Internal Medicine

## 2015-03-29 ENCOUNTER — Encounter: Payer: Self-pay | Admitting: Internal Medicine

## 2015-05-19 DIAGNOSIS — E039 Hypothyroidism, unspecified: Secondary | ICD-10-CM | POA: Insufficient documentation

## 2015-05-19 DIAGNOSIS — E7800 Pure hypercholesterolemia, unspecified: Secondary | ICD-10-CM | POA: Insufficient documentation

## 2015-05-19 DIAGNOSIS — I10 Essential (primary) hypertension: Secondary | ICD-10-CM | POA: Insufficient documentation

## 2015-06-01 ENCOUNTER — Ambulatory Visit: Payer: BC Managed Care – PPO | Admitting: Physician Assistant

## 2016-01-07 ENCOUNTER — Encounter: Payer: Self-pay | Admitting: Internal Medicine

## 2017-08-06 DIAGNOSIS — H903 Sensorineural hearing loss, bilateral: Secondary | ICD-10-CM | POA: Insufficient documentation

## 2017-08-07 DIAGNOSIS — J301 Allergic rhinitis due to pollen: Secondary | ICD-10-CM | POA: Insufficient documentation

## 2017-08-29 ENCOUNTER — Other Ambulatory Visit: Payer: Self-pay | Admitting: Physician Assistant

## 2017-08-29 ENCOUNTER — Ambulatory Visit
Admission: RE | Admit: 2017-08-29 | Discharge: 2017-08-29 | Disposition: A | Discharge status: Home / Self Care | Payer: BC Managed Care – PPO | Source: Ambulatory Visit | Attending: Physician Assistant | Admitting: Physician Assistant

## 2017-08-29 DIAGNOSIS — R059 Cough, unspecified: Secondary | ICD-10-CM

## 2017-08-29 DIAGNOSIS — R062 Wheezing: Secondary | ICD-10-CM

## 2017-08-29 DIAGNOSIS — R05 Cough: Secondary | ICD-10-CM

## 2018-12-17 ENCOUNTER — Other Ambulatory Visit: Payer: Self-pay

## 2018-12-17 DIAGNOSIS — Z20822 Contact with and (suspected) exposure to covid-19: Secondary | ICD-10-CM

## 2018-12-19 LAB — NOVEL CORONAVIRUS, NAA: SARS-CoV-2, NAA: NOT DETECTED

## 2019-07-10 IMAGING — CR DG CHEST 2V
2 series · 2 of 2 positions shown · non-contrast
Comparison: Chest x-ray dated 11/22/2006

CLINICAL DATA: Productive cough.  Wheezing.

EXAM:
CHEST - 2 VIEW

[w chest pa]
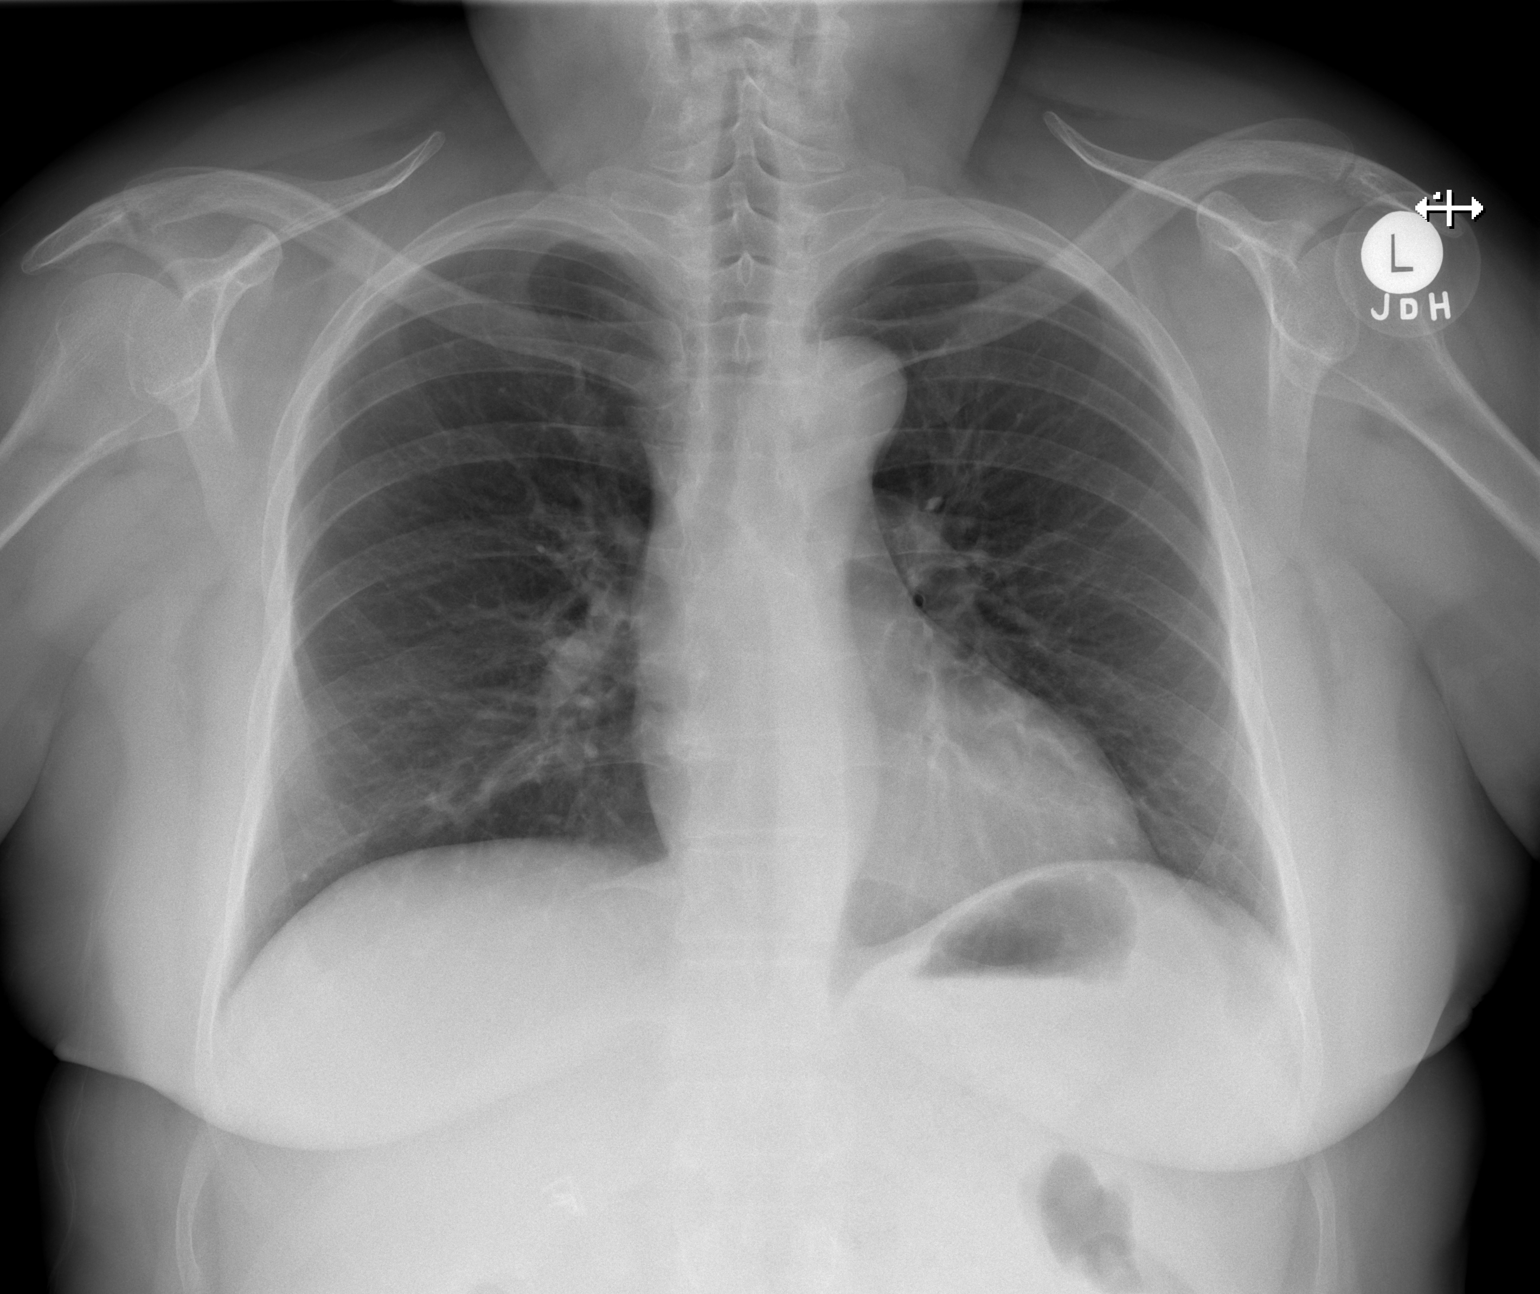

[w chest lat]
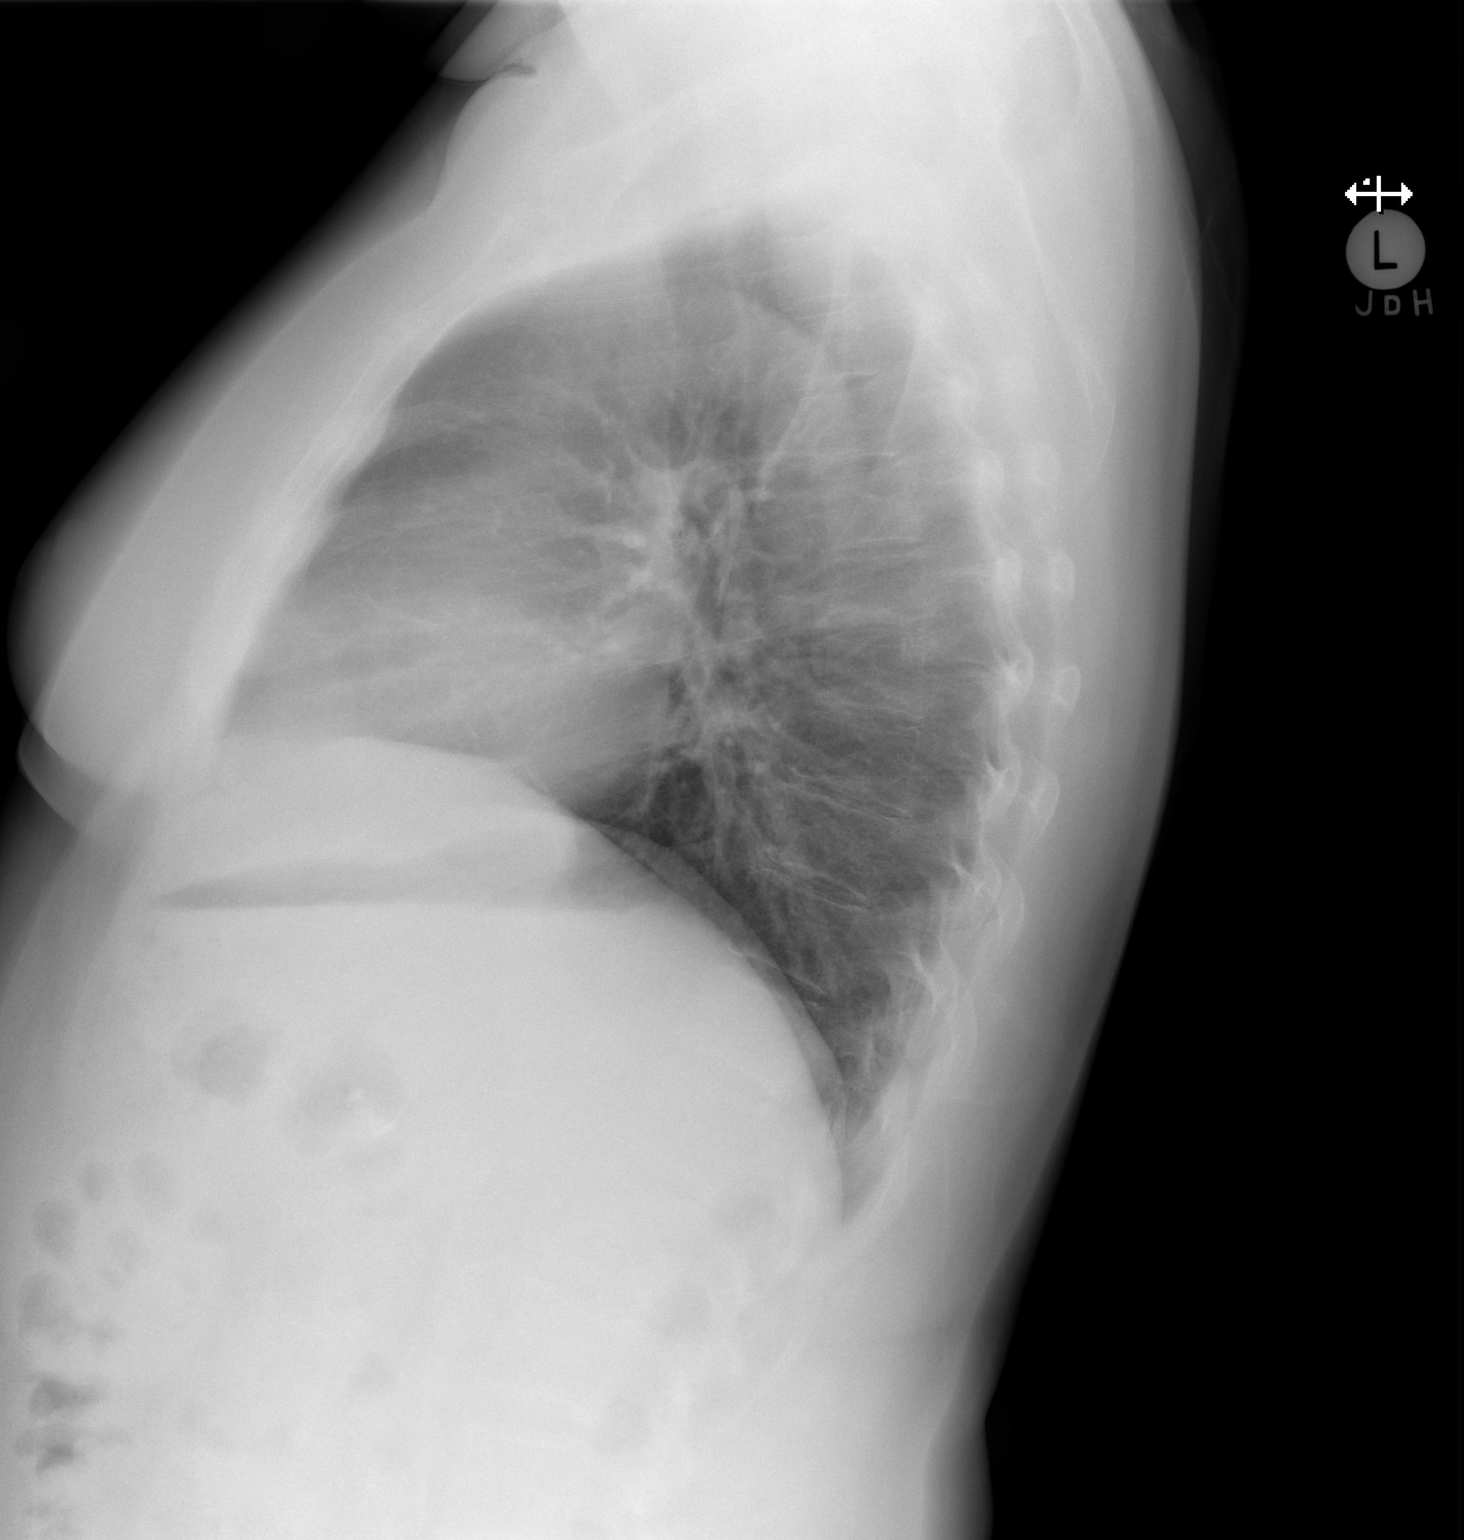

[2 of 2 positions shown; findings below may reference images not displayed]

FINDINGS: The heart size and mediastinal contours are within normal limits.
Both lungs are clear. The visualized skeletal structures are
unremarkable.
IMPRESSION: Normal exam.

## 2019-09-24 ENCOUNTER — Other Ambulatory Visit: Payer: Self-pay

## 2020-03-20 DIAGNOSIS — J189 Pneumonia, unspecified organism: Secondary | ICD-10-CM

## 2020-03-20 HISTORY — DX: Pneumonia, unspecified organism: J18.9

## 2020-09-29 ENCOUNTER — Encounter: Payer: Self-pay | Admitting: Pulmonary Disease

## 2020-09-29 ENCOUNTER — Ambulatory Visit: Payer: BC Managed Care – PPO | Admitting: Pulmonary Disease

## 2020-09-29 ENCOUNTER — Other Ambulatory Visit: Payer: Self-pay

## 2020-09-29 VITALS — BP 110/76 | HR 64 | Ht 63.0 in | Wt 165.2 lb

## 2020-09-29 DIAGNOSIS — J42 Unspecified chronic bronchitis: Secondary | ICD-10-CM

## 2020-09-29 DIAGNOSIS — R062 Wheezing: Secondary | ICD-10-CM

## 2020-09-29 DIAGNOSIS — J329 Chronic sinusitis, unspecified: Secondary | ICD-10-CM | POA: Diagnosis not present

## 2020-09-29 DIAGNOSIS — H9202 Otalgia, left ear: Secondary | ICD-10-CM

## 2020-09-29 DIAGNOSIS — Z8616 Personal history of COVID-19: Secondary | ICD-10-CM

## 2020-09-29 DIAGNOSIS — R0789 Other chest pain: Secondary | ICD-10-CM | POA: Diagnosis not present

## 2020-09-29 MED ORDER — BREZTRI AEROSPHERE 160-9-4.8 MCG/ACT IN AERO: 2.0000 | INHALATION_SPRAY | Freq: Two times a day (BID) | RESPIRATORY_TRACT | 3 refills | Status: DC

## 2020-09-29 MED ORDER — BREZTRI AEROSPHERE 160-9-4.8 MCG/ACT IN AERO: 2.0000 | INHALATION_SPRAY | Freq: Two times a day (BID) | RESPIRATORY_TRACT | 0 refills | Status: DC

## 2020-09-29 NOTE — Patient Instructions (Signed)
Thank you for visiting Dr. Valeta Harms at [facility]. Today we recommend the following:  PFTS prior to next office visit  Breztri samples today + prescription copay  Singulair 10mg  once daily  Zyrtec daily  Continue flonase nasal spray  Continue albuterol as needed  Return in about 3 months (around 12/30/2020) for with APP or Dr. Valeta Harms.    Please do your part to reduce the spread of COVID-19.

## 2020-09-29 NOTE — Progress Notes (Signed)
Synopsis: Referred in July 2022 for cough, hx of covid by Nickola Major, MD  Subjective:   PATIENT ID: Sara House GENDER: female DOB: 1956/03/17, MRN: [id number]  Chief Complaint  Patient presents with   Shortness of Breath     Consist coughing, wheezing, tightness in chest since covid in Jan tied and weak. Taking inhaler but not working.    This is a 65 year old female here today with complaints of cough and recurrent sinus congestion.  Every time she gets sick she gets chest congestion.  She had chronic bronchitis for a long time.  She has seasonal allergies.  Currently managed with antihistamine, Zyrtec as well as Singulair on occasion.  She has been placed on Advair in the past but had thrush symptoms and stopped.  She occasionally uses albuterol.  She has been seen in ER in urgent care several times this past year.  Her COVID-19 diagnosis in January caused URI symptoms which lingered for several weeks and months.  She had recurrent exacerbation of symptoms in July was seen in urgent care.  Patient has been treated with antibiotics and steroids several times this year.  She does occasionally feel better after being on prednisone for short period but unfortunately the cough and congestion returned.  She does feel better for about an hour after using her albuterol inhaler.   Past Medical History:  Diagnosis Date   History of COVID-19    HLD (hyperlipidemia)    HTN (hypertension)    Sinus congestion      Family History  Problem Relation Age of Onset   CVA Mother    Stroke Mother    Heart failure Mother    Hypertension Mother    CVA Father    Heart disease Father      Past Surgical History:  Procedure Laterality Date   DUPUYTREN CONTRACTURE RELEASE     GALLBLADDER SURGERY     LAPAROSCOPIC HYSTERECTOMY      Social History   Socioeconomic History   Marital status: Married    Spouse name: Not on file   Number of children: Not on file   Years of education:  Not on file   Highest education level: Not on file  Occupational History   Not on file  Tobacco Use   Smoking status: Never   Smokeless tobacco: Never  Substance and Sexual Activity   Alcohol use: Not on file   Drug use: Not on file   Sexual activity: Not on file  Other Topics Concern   Not on file  Social History Narrative   Not on file   Social Determinants of Health   Financial Resource Strain: Not on file  Food Insecurity: Not on file  Transportation Needs: Not on file  Physical Activity: Not on file  Stress: Not on file  Social Connections: Not on file  Intimate Partner Violence: Not on file     Allergies  Allergen Reactions   Cefdinir    Levofloxacin Swelling   Penicillins    Doxycycline Rash     Outpatient Medications Prior to Visit  Medication Sig Dispense Refill   ALBUTEROL SULFATE PO Take 90 mg by mouth.     aspirin EC 81 MG tablet Take 81 mg by mouth daily. Swallow whole.     cetirizine (ZYRTEC) 10 MG tablet Take 10 mg by mouth daily.     Cholecalciferol 25 MCG (1000 UT) tablet Take 1,000 Units by mouth daily.     cyanocobalamin 1000  MCG tablet Take 1,000 mcg by mouth daily.     docusate sodium (COLACE) 100 MG capsule Take 100 mg by mouth 2 (two) times daily.     famotidine (PEPCID) 20 MG tablet Take 20 mg by mouth 2 (two) times daily.     levothyroxine (SYNTHROID) 50 MCG tablet Take 50 mcg by mouth daily before breakfast.     losartan (COZAAR) 50 MG tablet Take 50 mg by mouth daily.     lovastatin (MEVACOR) 40 MG tablet Take 40 mg by mouth at bedtime.     MELATONIN PO Take 10 mg by mouth.     Omega-3 Fatty Acids (FISH OIL) 1000 MG CAPS Take 2,000 mg by mouth.     OMEPRAZOLE PO Take 40 mg by mouth.     propranolol (INDERAL) 80 MG tablet Take 80 mg by mouth 3 (three) times daily.     No facility-administered medications prior to visit.    Review of Systems  Constitutional:  Negative for chills, fever, malaise/fatigue and weight loss.  HENT:  Negative  for hearing loss, sore throat and tinnitus.   Eyes:  Negative for blurred vision and double vision.  Respiratory:  Positive for cough, sputum production and shortness of breath. Negative for hemoptysis, wheezing and stridor.   Cardiovascular:  Negative for chest pain, palpitations, orthopnea, leg swelling and PND.  Gastrointestinal:  Negative for abdominal pain, constipation, diarrhea, heartburn, nausea and vomiting.  Genitourinary:  Negative for dysuria, hematuria and urgency.  Musculoskeletal:  Negative for joint pain and myalgias.  Skin:  Negative for itching and rash.  Neurological:  Negative for dizziness, tingling, weakness and headaches.  Endo/Heme/Allergies:  Negative for environmental allergies. Does not bruise/bleed easily.  Psychiatric/Behavioral:  Negative for depression. The patient is not nervous/anxious and does not have insomnia.   All other systems reviewed and are negative.   Objective:  Physical Exam Vitals reviewed.  Constitutional:      General: She is not in acute distress.    Appearance: She is well-developed.  HENT:     Head: Normocephalic and atraumatic.  Eyes:     General: No scleral icterus.    Conjunctiva/sclera: Conjunctivae normal.     Pupils: Pupils are equal, round, and reactive to light.  Neck:     Vascular: No JVD.     Trachea: No tracheal deviation.  Cardiovascular:     Rate and Rhythm: Normal rate and regular rhythm.     Heart sounds: Normal heart sounds. No murmur heard. Pulmonary:     Effort: Pulmonary effort is normal. No tachypnea, accessory muscle usage or respiratory distress.     Breath sounds: No stridor. No wheezing, rhonchi or rales.  Abdominal:     General: Bowel sounds are normal. There is no distension.     Palpations: Abdomen is soft.     Tenderness: There is no abdominal tenderness.  Musculoskeletal:        General: No tenderness.     Cervical back: Neck supple.  Lymphadenopathy:     Cervical: No cervical adenopathy.   Skin:    General: Skin is warm and dry.     Capillary Refill: Capillary refill takes less than 2 seconds.     Findings: No rash.  Neurological:     Mental Status: She is alert and oriented to person, place, and time.  Psychiatric:        Behavior: Behavior normal.     Vitals:   09/29/20 1425  BP: 110/76  Pulse: 64  SpO2: 98%  Weight: 165 lb 3.2 oz (74.9 kg)  Height: 5\' 3"  (1.6 m)   98% on RA BMI Readings from Last 3 Encounters:  09/29/20 29.26 kg/m   Wt Readings from Last 3 Encounters:  09/29/20 165 lb 3.2 oz (74.9 kg)     CBC    Component Value Date/Time   WBC 6.4 10/21/2008 1000   RBC 4.83 10/21/2008 1000   HGB 14.0 10/21/2008 1000   HCT 42.6 10/21/2008 1000   PLT 201 10/21/2008 1000   MCV 88.3 10/21/2008 1000   MCHC 32.8 10/21/2008 1000   RDW 14.1 10/21/2008 1000   LYMPHSABS 2.3 10/21/2008 1000   MONOABS 0.5 10/21/2008 1000   EOSABS 0.1 10/21/2008 1000   BASOSABS 0.0 10/21/2008 1000      Chest Imaging: No recent chest imaging  Pulmonary Functions Testing Results: No flowsheet data found.  FeNO:   Pathology:   Echocardiogram:   Heart Catheterization:     Assessment & Plan:     ICD-10-CM   1. Chronic bronchitis, unspecified chronic bronchitis type ([place])  J42     2. Chronic sinusitis, unspecified location  J32.9     3. Left ear pain  H92.02     4. Chest tightness  R07.89     5. Wheezing  R06.2     6. History of COVID-19  Z86.16       Discussion: This is a 65 year old female, chronic bronchitis, chronic sinusitis symptoms, seasonal allergies, comes today with chest tightness and wheezing and recurrent symptoms since COVID-19 diagnosis in January.  I think a lot of her symptoms are related to her ongoing allergies and reactive airway disease.  I think it was just flared up by COVID-19.  She was placed on Advair in the past developed thrush.  Set some point time folks thought that she may have persistent asthma symptoms.  Plan: I  think she would benefit from maintenance inhaler regimen. Will give samples today of Breztri plus co-pay card to help with cost. Continue Singulair Continue antihistamine Continue albuterol for shortness of breath and wheezing. We will get pulmonary function test prior to next office visit. I suspect were dealing with a diagnosis of persistent asthma.   Current Outpatient Medications:    ALBUTEROL SULFATE PO, Take 90 mg by mouth., Disp: , Rfl:    aspirin EC 81 MG tablet, Take 81 mg by mouth daily. Swallow whole., Disp: , Rfl:    Budeson-Glycopyrrol-Formoterol (BREZTRI AEROSPHERE) 160-9-4.8 MCG/ACT AERO, Inhale 2 puffs into the lungs in the morning and at bedtime., Disp: 10.7 g, Rfl: 3   cetirizine (ZYRTEC) 10 MG tablet, Take 10 mg by mouth daily., Disp: , Rfl:    Cholecalciferol 25 MCG (1000 UT) tablet, Take 1,000 Units by mouth daily., Disp: , Rfl:    cyanocobalamin 1000 MCG tablet, Take 1,000 mcg by mouth daily., Disp: , Rfl:    docusate sodium (COLACE) 100 MG capsule, Take 100 mg by mouth 2 (two) times daily., Disp: , Rfl:    famotidine (PEPCID) 20 MG tablet, Take 20 mg by mouth 2 (two) times daily., Disp: , Rfl:    levothyroxine (SYNTHROID) 50 MCG tablet, Take 50 mcg by mouth daily before breakfast., Disp: , Rfl:    losartan (COZAAR) 50 MG tablet, Take 50 mg by mouth daily., Disp: , Rfl:    lovastatin (MEVACOR) 40 MG tablet, Take 40 mg by mouth at bedtime., Disp: , Rfl:    MELATONIN PO, Take 10 mg by mouth., Disp: ,  Rfl:    Omega-3 Fatty Acids (FISH OIL) 1000 MG CAPS, Take 2,000 mg by mouth., Disp: , Rfl:    OMEPRAZOLE PO, Take 40 mg by mouth., Disp: , Rfl:    propranolol (INDERAL) 80 MG tablet, Take 80 mg by mouth 3 (three) times daily., Disp: , Rfl:    Garner Nash, DO [facility] 09/29/2020 5:10 PM

## 2020-12-29 ENCOUNTER — Other Ambulatory Visit: Payer: Self-pay | Admitting: Pulmonary Disease

## 2020-12-29 DIAGNOSIS — J42 Unspecified chronic bronchitis: Secondary | ICD-10-CM

## 2020-12-30 ENCOUNTER — Ambulatory Visit: Payer: BC Managed Care – PPO | Admitting: Adult Health

## 2020-12-30 ENCOUNTER — Ambulatory Visit (INDEPENDENT_AMBULATORY_CARE_PROVIDER_SITE_OTHER): Payer: BC Managed Care – PPO | Admitting: Pulmonary Disease

## 2020-12-30 ENCOUNTER — Encounter: Payer: Self-pay | Admitting: Adult Health

## 2020-12-30 ENCOUNTER — Other Ambulatory Visit: Payer: Self-pay

## 2020-12-30 DIAGNOSIS — K219 Gastro-esophageal reflux disease without esophagitis: Secondary | ICD-10-CM | POA: Diagnosis not present

## 2020-12-30 DIAGNOSIS — J302 Other seasonal allergic rhinitis: Secondary | ICD-10-CM

## 2020-12-30 DIAGNOSIS — J4530 Mild persistent asthma, uncomplicated: Secondary | ICD-10-CM

## 2020-12-30 DIAGNOSIS — J45909 Unspecified asthma, uncomplicated: Secondary | ICD-10-CM | POA: Insufficient documentation

## 2020-12-30 DIAGNOSIS — J42 Unspecified chronic bronchitis: Secondary | ICD-10-CM

## 2020-12-30 DIAGNOSIS — J309 Allergic rhinitis, unspecified: Secondary | ICD-10-CM | POA: Insufficient documentation

## 2020-12-30 LAB — PULMONARY FUNCTION TEST
DL/VA % pred: 115 %
DL/VA: 4.86 ml/min/mmHg/L
DLCO cor % pred: 90 %
DLCO cor: 17.3 ml/min/mmHg
DLCO unc % pred: 90 %
DLCO unc: 17.3 ml/min/mmHg
FEF 25-75 Post: 2.04 L/sec
FEF 25-75 Pre: 1.63 L/sec
FEF2575-%Change-Post: 25 %
FEF2575-%Pred-Post: 98 %
FEF2575-%Pred-Pre: 78 %
FEV1-%Change-Post: 3 %
FEV1-%Pred-Post: 74 %
FEV1-%Pred-Pre: 72 %
FEV1-Post: 1.74 L
FEV1-Pre: 1.68 L
FEV1FVC-%Change-Post: 1 %
FEV1FVC-%Pred-Pre: 106 %
FEV6-%Change-Post: 1 %
FEV6-%Pred-Post: 71 %
FEV6-%Pred-Pre: 70 %
FEV6-Post: 2.08 L
FEV6-Pre: 2.05 L
FEV6FVC-%Change-Post: 0 %
FEV6FVC-%Pred-Post: 104 %
FEV6FVC-%Pred-Pre: 104 %
FVC-%Change-Post: 1 %
FVC-%Pred-Post: 68 %
FVC-%Pred-Pre: 67 %
FVC-Post: 2.09 L
FVC-Pre: 2.05 L
Post FEV1/FVC ratio: 83 %
Post FEV6/FVC ratio: 100 %
Pre FEV1/FVC ratio: 82 %
Pre FEV6/FVC Ratio: 100 %
RV % pred: 90 %
RV: 1.84 L
TLC % pred: 83 %
TLC: 4.08 L

## 2020-12-30 NOTE — Progress Notes (Signed)
_0  ID: Sara House, female    DOB: December 08, 1955, 65 y.o.   MRN: [id number]  Chief Complaint  Patient presents with   Follow-up    Referring provider: Heywood Bene, *  HPI: 65 year old female never smoker seen for pulmonary consult September 29, 2020 for recurrent bronchitis Medical history significant for chronic sinusitis and allergic rhinitis. COVID-19 infection January 2022. 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    12/30/2020 Follow up : Asthma, Bronchitis  Patient returns for a 44-monthfollow-up.  Patient was seen in July 2022 for a pulmonary consult.  Felt to have some recurrent bronchitis and ongoing cough since having COVID-19 infection in January 2022.  She was felt to had some mild asthma versus reactive airways.  She was started on Breztri.  Since last visit patient says she is feeling better , has less cough  wheezing has totally resolved after starting BREZTRI . .Marland KitchenDid have flare 2 months ago, treated with prednisone taper. Sympotms resolved.  Remains on Singulair and Zyrtec 124mdaily  On omeprazole daily for GERD .  Is on Propanolol .  Pulmonary function test were done today that showed moderate restriction with an FEV1 at 74%, ratio 83, FVC 68%, no significant bronchodilator response, DLCO 90%.      Allergies  Allergen Reactions   Cefdinir    Levofloxacin Swelling   Penicillins    Doxycycline Rash     There is no immunization history on file for this patient.  Past Medical History:  Diagnosis Date   History of COVID-19    HLD (hyperlipidemia)    HTN (hypertension)    Sinus congestion     Tobacco History: Social History   Tobacco Use  Smoking Status Never  Smokeless Tobacco Never   Counseling given: Not Answered   Outpatient Medications Prior to Visit  Medication Sig Dispense Refill   ALBUTEROL SULFATE PO Take 90 mg by  mouth.     aspirin EC 81 MG tablet Take 81 mg by mouth daily. Swallow whole.     Budeson-Glycopyrrol-Formoterol (BREZTRI AEROSPHERE) 160-9-4.8 MCG/ACT AERO Inhale 2 puffs into the lungs in the morning and at bedtime. 10.7 g 3   cetirizine (ZYRTEC) 10 MG tablet Take 10 mg by mouth daily.     Cholecalciferol 25 MCG (1000 UT) tablet Take 1,000 Units by mouth daily.     cyanocobalamin 1000 MCG tablet Take 1,000 mcg by mouth daily.     docusate sodium (COLACE) 100 MG capsule Take 100 mg by mouth 2 (two) times daily.     levothyroxine (SYNTHROID) 50 MCG tablet Take 50 mcg by mouth daily before breakfast.     losartan (COZAAR) 50 MG tablet Take 50 mg by mouth daily.     lovastatin (MEVACOR) 40 MG tablet Take 40 mg by mouth at bedtime.     MELATONIN PO Take 10 mg by mouth.     Omega-3 Fatty Acids (FISH OIL) 1000 MG CAPS Take 2,000 mg by mouth.     OMEPRAZOLE PO Take 40 mg by mouth.     propranolol (INDERAL) 80 MG tablet Take 80 mg by mouth daily at 2 PM. 1 time daily     famotidine (PEPCID) 20 MG tablet Take 20 mg by mouth 2 (two) times daily.     No facility-administered medications prior to visit.     Review of Systems:  Constitutional:   No  weight loss, night sweats,  Fevers, chills,  +fatigue, or  lassitude.  HEENT:   No headaches,  Difficulty swallowing,  Tooth/dental problems, or  Sore throat,                No sneezing, itching, ear ache,  +nasal congestion, post nasal drip,   CV:  No chest pain,  Orthopnea, PND, swelling in lower extremities, anasarca, dizziness, palpitations, syncope.   GI  No heartburn, indigestion, abdominal pain, nausea, vomiting, diarrhea, change in bowel habits, loss of appetite, bloody stools.   Resp: .  No chest wall deformity  Skin: no rash or lesions.  GU: no dysuria, change in color of urine, no urgency or frequency.  No flank pain, no hematuria   MS:  No joint pain or swelling.  No decreased range of motion.  No back pain.    Physical  Exam  BP 128/78 (BP Location: Left Arm, Patient Position: Sitting, Cuff Size: Normal)   Pulse (!) 58   Temp 98.4 F (36.9 C) (Oral)   Ht 5' 3" (1.6 m)   Wt 168 lb 9.6 oz (76.5 kg)   SpO2 96%   BMI 29.87 kg/m   GEN: A/Ox3; pleasant , NAD, well nourished    HEENT:  [place]/AT,   NOSE-clear, THROAT-clear, no lesions, no postnasal drip or exudate noted.   NECK:  Supple w/ fair ROM; no JVD; normal carotid impulses w/o bruits; no thyromegaly or nodules palpated; no lymphadenopathy.    RESP  Clear  P & A; w/o, wheezes/ rales/ or rhonchi. no accessory muscle use, no dullness to percussion  CARD:  RRR, no m/r/g, no peripheral edema, pulses intact, no cyanosis or clubbing.  GI:   Soft & nt; nml bowel sounds; no organomegaly or masses detected.   Musco: Warm bil, no deformities or joint swelling noted.   Neuro: alert, no focal deficits noted.    Skin: Warm, no lesions or rashes    Lab Results:  CBC    Component Value Date/Time   WBC 6.4 10/21/2008 1000   RBC 4.83 10/21/2008 1000   HGB 14.0 10/21/2008 1000   HCT 42.6 10/21/2008 1000   PLT 201 10/21/2008 1000   MCV 88.3 10/21/2008 1000   MCHC 32.8 10/21/2008 1000   RDW 14.1 10/21/2008 1000   LYMPHSABS 2.3 10/21/2008 1000   MONOABS 0.5 10/21/2008 1000   EOSABS 0.1 10/21/2008 1000   BASOSABS 0.0 10/21/2008 1000    BMET    Component Value Date/Time   NA 140 10/21/2008 1000   K 4.4 10/21/2008 1000   CL 106 10/21/2008 1000   CO2 28 10/21/2008 1000   GLUCOSE 97 10/21/2008 1000   BUN 8 10/21/2008 1000   CREATININE 0.75 10/21/2008 1000   CALCIUM 9.7 10/21/2008 1000   GFRNONAA >60 10/21/2008 1000   GFRAA  10/21/2008 1000    >60        The eGFR has been calculated using the MDRD equation. This calculation has not been validated in all clinical situations. eGFR's persistently <60 mL/min signify possible Chronic Kidney Disease.    BNP No results found for: BNP  ProBNP No results found for: PROBNP  Imaging: No  results found.    PFT Results Latest Ref Rng & Units 12/30/2020  FVC-Pre L 2.05  FVC-Predicted Pre % 67  FVC-Post L 2.09  FVC-Predicted Post % 68  Pre FEV1/FVC % % 82  Post FEV1/FCV % % 83  FEV1-Pre L 1.68  FEV1-Predicted Pre % 72  FEV1-Post L 1.74  DLCO uncorrected ml/min/mmHg 17.30  DLCO UNC% % 90  DLCO corrected ml/min/mmHg 17.30  DLCO COR %Predicted % 90  DLVA Predicted % 115  TLC L 4.08  TLC % Predicted % 83  RV % Predicted % 90    No results found for: NITRICOXIDE      Assessment & Plan:   Asthma Continue on current regimen  Doing well  Patient with increased flares after recent viral infections earlier this year in January and again in May.  Also had a recent flare couple months ago.  Would also look at propranolol may be increasing her asthma symptoms as well.  Plan  Patient Instructions  Continue on BREZTRI 2 puffs Twice daily  , rinse after use.  Continue on Singulair and Zyrtec 32m daily  Albuterol inhaler As needed   Flu shot as planned  Follow up with Dr. IValeta Harmsin 6 months and As needed       Allergic rhinitis Allergic rhinitis continue on current regimen  GERD Continue on GERD diet and GERD management with omeprazole     TRexene Edison NP 12/30/2020

## 2020-12-30 NOTE — Patient Instructions (Signed)
Continue on BREZTRI 2 puffs Twice daily  , rinse after use.  Continue on Singulair and Zyrtec 10mg  daily  Albuterol inhaler As needed   Flu shot as planned  Follow up with Dr. Valeta Harms in 6 months and As needed

## 2020-12-30 NOTE — Assessment & Plan Note (Signed)
Continue on GERD diet and GERD management with omeprazole

## 2020-12-30 NOTE — Assessment & Plan Note (Signed)
Allergic rhinitis continue on current regimen

## 2020-12-30 NOTE — Assessment & Plan Note (Addendum)
Continue on current regimen  Doing well  Patient with increased flares after recent viral infections earlier this year in January and again in May.  Also had a recent flare couple months ago.  Would also look at propranolol may be increasing her asthma symptoms as well.  Plan  Patient Instructions  Continue on BREZTRI 2 puffs Twice daily  , rinse after use.  Continue on Singulair and Zyrtec 10mg  daily  Albuterol inhaler As needed   Flu shot as planned  Follow up with Dr. Valeta Harms in 6 months and As needed

## 2020-12-30 NOTE — Progress Notes (Signed)
PFT done today. 

## 2021-07-04 ENCOUNTER — Ambulatory Visit: Payer: BC Managed Care – PPO | Admitting: Pulmonary Disease

## 2021-07-04 ENCOUNTER — Encounter: Payer: Self-pay | Admitting: Pulmonary Disease

## 2021-07-04 VITALS — BP 120/80 | HR 58 | Temp 97.9°F | Ht 63.0 in | Wt 176.2 lb

## 2021-07-04 DIAGNOSIS — J302 Other seasonal allergic rhinitis: Secondary | ICD-10-CM

## 2021-07-04 DIAGNOSIS — J4530 Mild persistent asthma, uncomplicated: Secondary | ICD-10-CM | POA: Diagnosis not present

## 2021-07-04 DIAGNOSIS — J42 Unspecified chronic bronchitis: Secondary | ICD-10-CM

## 2021-07-04 DIAGNOSIS — K219 Gastro-esophageal reflux disease without esophagitis: Secondary | ICD-10-CM | POA: Diagnosis not present

## 2021-07-04 DIAGNOSIS — Z8616 Personal history of COVID-19: Secondary | ICD-10-CM

## 2021-07-04 MED ORDER — BREZTRI AEROSPHERE 160-9-4.8 MCG/ACT IN AERO: 2.0000 | INHALATION_SPRAY | Freq: Two times a day (BID) | RESPIRATORY_TRACT | 11 refills | Status: DC

## 2021-07-04 MED ORDER — MOMETASONE FUROATE 50 MCG/ACT NA SUSP: 2.0000 | Freq: Every day | NASAL | 11 refills | Status: DC

## 2021-07-04 NOTE — Patient Instructions (Signed)
Thank you for visiting Dr. Valeta Harms at [facility]. ?Today we recommend the following: ? ?Breztri prescription and another coupon card ?1 year of refills  ? ?Meds ordered this encounter  ?Medications  ? mometasone (NASONEX) 50 MCG/ACT nasal spray  ?  Sig: Place 2 sprays into the nose daily.  ?  Dispense:  1 each  ?  Refill:  11  ? ?Return in about 1 year (around 07/05/2022) for with APP or Dr. Valeta Harms. ? ? ? ?Please do your part to reduce the spread of COVID-19.  ? ?

## 2021-07-04 NOTE — Progress Notes (Signed)
? ?Synopsis: Referred in July 2022 for cough, hx of covid by Heywood Bene, * ? ?Subjective:  ? ?PATIENT ID: Sara House GENDER: female DOB: 12-07-1955, MRN: [id number] ? ?Chief Complaint  ?Patient presents with  ? Follow-up  ?  Pt states she started having problems with her allergies/sinuses recently due to the pollen.  ? ? ?This is a 66 year old female here today with complaints of cough and recurrent sinus congestion.  Every time she gets sick she gets chest congestion.  She had chronic bronchitis for a long time.  She has seasonal allergies.  Currently managed with antihistamine, Zyrtec as well as Singulair on occasion.  She has been placed on Advair in the past but had thrush symptoms and stopped.  She occasionally uses albuterol.  She has been seen in ER in urgent care several times this past year.  Her COVID-19 diagnosis in January caused URI symptoms which lingered for several weeks and months.  She had recurrent exacerbation of symptoms in July was seen in urgent care.  Patient has been treated with antibiotics and steroids several times this year.  She does occasionally feel better after being on prednisone for short period but unfortunately the cough and congestion returned.  She does feel better for about an hour after using her albuterol inhaler. ? ?OV 07/04/2021: Here today for follow-up.  She is doing really well.  Her cough is gone.  She has been using her antihistamines, Zyrtec and Singulair.  Rarely uses her albuterol inhaler.  Well maintained on Breztri at this time.  She is thankful that her cough has gone away and she is living back again in a normal life.  She does state that she has trouble breathing occasionally when she encounters strong smells.  She has been trying to use her maintenance inhaler daily but she was afraid she was going to run out so she has been staggering her doses. ? ? ?Past Medical History:  ?Diagnosis Date  ? History of COVID-19   ? HLD (hyperlipidemia)    ? HTN (hypertension)   ? Sinus congestion   ?  ? ?Family History  ?Problem Relation Age of Onset  ? CVA Mother   ? Stroke Mother   ? Heart failure Mother   ? Hypertension Mother   ? CVA Father   ? Heart disease Father   ?  ? ?Past Surgical History:  ?Procedure Laterality Date  ? DUPUYTREN CONTRACTURE RELEASE    ? GALLBLADDER SURGERY    ? LAPAROSCOPIC HYSTERECTOMY    ? ? ?Social History  ? ?Socioeconomic History  ? Marital status: Married  ?  Spouse name: Not on file  ? Number of children: Not on file  ? Years of education: Not on file  ? Highest education level: Not on file  ?Occupational History  ? Not on file  ?Tobacco Use  ? Smoking status: Never  ? Smokeless tobacco: Never  ?Substance and Sexual Activity  ? Alcohol use: Not on file  ? Drug use: Not on file  ? Sexual activity: Not on file  ?Other Topics Concern  ? Not on file  ?Social History Narrative  ? Not on file  ? ?Social Determinants of Health  ? ?Financial Resource Strain: Not on file  ?Food Insecurity: Not on file  ?Transportation Needs: Not on file  ?Physical Activity: Not on file  ?Stress: Not on file  ?Social Connections: Not on file  ?Intimate Partner Violence: Not on file  ?  ? ?  Allergies  ?Allergen Reactions  ? Cefdinir   ? Levofloxacin Swelling  ? Penicillins   ? Doxycycline Rash  ?  ? ?Outpatient Medications Prior to Visit  ?Medication Sig Dispense Refill  ? ALBUTEROL SULFATE PO Take 90 mg by mouth.    ? aspirin EC 81 MG tablet Take 81 mg by mouth daily. Swallow whole.    ? Budeson-Glycopyrrol-Formoterol (BREZTRI AEROSPHERE) 160-9-4.8 MCG/ACT AERO Inhale 2 puffs into the lungs in the morning and at bedtime. 10.7 g 3  ? cetirizine (ZYRTEC) 10 MG tablet Take 10 mg by mouth daily.    ? Cholecalciferol 25 MCG (1000 UT) tablet Take 1,000 Units by mouth daily.    ? cyanocobalamin 1000 MCG tablet Take 1,000 mcg by mouth daily.    ? docusate sodium (COLACE) 100 MG capsule Take 100 mg by mouth 2 (two) times daily.    ? levothyroxine (SYNTHROID) 50 MCG  tablet Take 50 mcg by mouth daily before breakfast.    ? losartan (COZAAR) 50 MG tablet Take 50 mg by mouth daily.    ? lovastatin (MEVACOR) 40 MG tablet Take 40 mg by mouth at bedtime.    ? MELATONIN PO Take 10 mg by mouth.    ? montelukast (SINGULAIR) 10 MG tablet Take 1 tablet by mouth daily.    ? Omega-3 Fatty Acids (FISH OIL) 1000 MG CAPS Take 2,000 mg by mouth.    ? OMEPRAZOLE PO Take 40 mg by mouth.    ? propranolol (INDERAL) 80 MG tablet Take 80 mg by mouth daily at 2 PM. 1 time daily    ? ?No facility-administered medications prior to visit.  ? ? ?Review of Systems  ?Constitutional:  Negative for chills, fever, malaise/fatigue and weight loss.  ?HENT:  Negative for hearing loss, sore throat and tinnitus.   ?Eyes:  Negative for blurred vision and double vision.  ?Respiratory:  Negative for cough, hemoptysis, sputum production, shortness of breath, wheezing and stridor.   ?Cardiovascular:  Negative for chest pain, palpitations, orthopnea, leg swelling and PND.  ?Gastrointestinal:  Negative for abdominal pain, constipation, diarrhea, heartburn, nausea and vomiting.  ?Genitourinary:  Negative for dysuria, hematuria and urgency.  ?Musculoskeletal:  Negative for joint pain and myalgias.  ?Skin:  Negative for itching and rash.  ?Neurological:  Negative for dizziness, tingling, weakness and headaches.  ?Endo/Heme/Allergies:  Negative for environmental allergies. Does not bruise/bleed easily.  ?Psychiatric/Behavioral:  Negative for depression. The patient is not nervous/anxious and does not have insomnia.   ?All other systems reviewed and are negative. ? ? ?Objective:  ?Physical Exam ?Vitals reviewed.  ?Constitutional:   ?   General: She is not in acute distress. ?   Appearance: She is well-developed.  ?HENT:  ?   Head: Normocephalic and atraumatic.  ?Eyes:  ?   General: No scleral icterus. ?   Conjunctiva/sclera: Conjunctivae normal.  ?   Pupils: Pupils are equal, round, and reactive to light.  ?Neck:  ?    Vascular: No JVD.  ?   Trachea: No tracheal deviation.  ?Cardiovascular:  ?   Rate and Rhythm: Normal rate and regular rhythm.  ?   Heart sounds: Normal heart sounds. No murmur heard. ?Pulmonary:  ?   Effort: Pulmonary effort is normal. No tachypnea, accessory muscle usage or respiratory distress.  ?   Breath sounds: No stridor. No wheezing, rhonchi or rales.  ?Abdominal:  ?   General: There is no distension.  ?   Palpations: Abdomen is soft.  ?  Tenderness: There is no abdominal tenderness.  ?Musculoskeletal:     ?   General: No tenderness.  ?   Cervical back: Neck supple.  ?Lymphadenopathy:  ?   Cervical: No cervical adenopathy.  ?Skin: ?   General: Skin is warm and dry.  ?   Capillary Refill: Capillary refill takes less than 2 seconds.  ?   Findings: No rash.  ?Neurological:  ?   Mental Status: She is alert and oriented to person, place, and time.  ?Psychiatric:     ?   Behavior: Behavior normal.  ? ? ? ?Vitals:  ? 07/04/21 1632  ?BP: 120/80  ?Pulse: (!) 58  ?Temp: 97.9 ?F (36.6 ?C)  ?TempSrc: Oral  ?SpO2: 97%  ?Weight: 176 lb 3.2 oz (79.9 kg)  ?Height: '5\' 3"'$  (1.6 m)  ? ?97% on RA ?BMI Readings from Last 3 Encounters:  ?07/04/21 31.21 kg/m?  ?12/30/20 29.87 kg/m?  ?09/29/20 29.26 kg/m?  ? ?Wt Readings from Last 3 Encounters:  ?07/04/21 176 lb 3.2 oz (79.9 kg)  ?12/30/20 168 lb 9.6 oz (76.5 kg)  ?09/29/20 165 lb 3.2 oz (74.9 kg)  ? ? ? ?CBC ?   ?Component Value Date/Time  ? WBC 6.4 10/21/2008 1000  ? RBC 4.83 10/21/2008 1000  ? HGB 14.0 10/21/2008 1000  ? HCT 42.6 10/21/2008 1000  ? PLT 201 10/21/2008 1000  ? MCV 88.3 10/21/2008 1000  ? MCHC 32.8 10/21/2008 1000  ? RDW 14.1 10/21/2008 1000  ? LYMPHSABS 2.3 10/21/2008 1000  ? MONOABS 0.5 10/21/2008 1000  ? EOSABS 0.1 10/21/2008 1000  ? BASOSABS 0.0 10/21/2008 1000  ? ? ? ? ?Chest Imaging: ?No recent chest imaging ? ?Pulmonary Functions Testing Results: ? ?  Latest Ref Rng & Units 12/30/2020  ?  2:45 PM  ?PFT Results  ?FVC-Pre L 2.05    ?FVC-Predicted Pre % 67     ?FVC-Post L 2.09    ?FVC-Predicted Post % 68    ?Pre FEV1/FVC % % 82    ?Post FEV1/FCV % % 83    ?FEV1-Pre L 1.68    ?FEV1-Predicted Pre % 72    ?FEV1-Post L 1.74    ?DLCO uncorrected ml/min/mmHg 17.30    ?DLCO

## 2021-10-20 ENCOUNTER — Other Ambulatory Visit: Payer: Self-pay | Admitting: Physician Assistant

## 2021-10-20 DIAGNOSIS — Z1231 Encounter for screening mammogram for malignant neoplasm of breast: Secondary | ICD-10-CM

## 2021-10-21 ENCOUNTER — Other Ambulatory Visit: Payer: Self-pay | Admitting: Physician Assistant

## 2021-10-21 DIAGNOSIS — E2839 Other primary ovarian failure: Secondary | ICD-10-CM

## 2021-11-03 ENCOUNTER — Ambulatory Visit
Admission: RE | Admit: 2021-11-03 | Discharge: 2021-11-03 | Disposition: A | Discharge status: Home / Self Care | Payer: Medicare Other | Source: Ambulatory Visit | Attending: Physician Assistant | Admitting: Physician Assistant

## 2021-11-03 DIAGNOSIS — Z1231 Encounter for screening mammogram for malignant neoplasm of breast: Secondary | ICD-10-CM

## 2021-11-08 ENCOUNTER — Other Ambulatory Visit: Payer: Self-pay | Admitting: Physician Assistant

## 2021-11-08 DIAGNOSIS — R928 Other abnormal and inconclusive findings on diagnostic imaging of breast: Secondary | ICD-10-CM

## 2021-11-16 ENCOUNTER — Ambulatory Visit
Admission: RE | Admit: 2021-11-16 | Discharge: 2021-11-16 | Disposition: A | Discharge status: Home / Self Care | Payer: Medicare Other | Source: Ambulatory Visit | Attending: Physician Assistant | Admitting: Physician Assistant

## 2021-11-16 ENCOUNTER — Other Ambulatory Visit: Payer: Self-pay | Admitting: Physician Assistant

## 2021-11-16 DIAGNOSIS — R928 Other abnormal and inconclusive findings on diagnostic imaging of breast: Secondary | ICD-10-CM

## 2021-11-16 DIAGNOSIS — N6320 Unspecified lump in the left breast, unspecified quadrant: Secondary | ICD-10-CM

## 2021-11-22 ENCOUNTER — Other Ambulatory Visit: Payer: Self-pay | Admitting: Radiology

## 2021-11-22 ENCOUNTER — Ambulatory Visit
Admission: RE | Admit: 2021-11-22 | Discharge: 2021-11-22 | Disposition: A | Discharge status: Home / Self Care | Payer: Medicare Other | Source: Ambulatory Visit | Attending: Physician Assistant | Admitting: Physician Assistant

## 2021-11-22 DIAGNOSIS — N6320 Unspecified lump in the left breast, unspecified quadrant: Secondary | ICD-10-CM

## 2021-11-22 DIAGNOSIS — C50919 Malignant neoplasm of unspecified site of unspecified female breast: Secondary | ICD-10-CM

## 2021-11-22 HISTORY — DX: Malignant neoplasm of unspecified site of unspecified female breast: C50.919

## 2021-11-25 ENCOUNTER — Ambulatory Visit: Payer: Self-pay | Admitting: General Surgery

## 2021-11-25 ENCOUNTER — Telehealth: Payer: Self-pay | Admitting: Radiation Oncology

## 2021-11-25 DIAGNOSIS — C50412 Malignant neoplasm of upper-outer quadrant of left female breast: Secondary | ICD-10-CM

## 2021-11-25 NOTE — Telephone Encounter (Signed)
9/8 @ 2:31 pm called patient's contact number line was busy/could not leave voicemail.  Will try back later.

## 2021-11-28 ENCOUNTER — Telehealth: Payer: Self-pay | Admitting: Hematology and Oncology

## 2021-11-28 DIAGNOSIS — C50412 Malignant neoplasm of upper-outer quadrant of left female breast: Secondary | ICD-10-CM | POA: Insufficient documentation

## 2021-11-28 NOTE — Progress Notes (Signed)
New Breast Cancer Diagnosis: Left Breast UOQ  Did patient present with symptoms (if so, please note symptoms) or screening mammography?:Screening Mass    Location and Extent of disease :left breast. Located at 6 o'clock position, measured 8 mm in greatest dimension. Adenopathy no.  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 Toth 11/25/2021 -I have discussed with her in detail the different options for treatment and at this point she favors breast conservation which I feel is very reasonable.  -She is also a good candidate for sentinel node biopsy. -I will go ahead and refer her to medical and radiation oncology to discuss adjuvant therapy.  -We will also send her to physical therapy for preoperative lymphedema testing. -Surgery unscheduled at this time.   Medical oncologist, treatment if any:    Family History of Breast/Ovarian/Prostate Cancer:   Lymphedema issues, if any:      Pain issues, if any:     SAFETY ISSUES: Prior radiation?  Pacemaker/ICD?  Possible current pregnancy? Hysterectomy Is the patient on methotrexate?   Current Complaints / other details:   

## 2021-11-28 NOTE — Therapy (Signed)
OUTPATIENT PHYSICAL THERAPY BREAST CANCER BASELINE EVALUATION   Patient Name: Sara House MRN: [id number] DOB:1955/05/12, 66 y.o., female Today's Date: 11/29/2021   PT End of Session - 11/29/21 0836     Visit Number 1    Number of Visits 2    Date for PT Re-Evaluation 01/24/22    PT Start Time 0800    PT Stop Time 0830    PT Time Calculation (min) 30 min    Activity Tolerance Patient tolerated treatment well    Behavior During Therapy [facility] for tasks assessed/performed             Past Medical History:  Diagnosis Date   History of COVID-19    HLD (hyperlipidemia)    HTN (hypertension)    Sinus congestion    Past Surgical History:  Procedure Laterality Date   DUPUYTREN CONTRACTURE RELEASE     GALLBLADDER SURGERY     LAPAROSCOPIC HYSTERECTOMY     Patient Active Problem List   Diagnosis Date Noted   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 Sometimes my left  arm gets stiff due to an old surgery.    PERTINENT HISTORY:  Will be having left lumpectomy and SLNB on unknown date due to IDC of the left breast.  Tumor markers unknown at this time.  Hx of left surgery RCR.   PATIENT GOALS   reduce lymphedema risk and learn post op HEP.   PAIN:  Are you having pain? I do get achiness with weather   PRECAUTIONS: Active CA   HAND DOMINANCE: left  WEIGHT BEARING RESTRICTIONS No  FALLS:  Has patient fallen  in last 6 months? No  LIVING ENVIRONMENT: Patient lives with: spouse  OCCUPATION: retired   LEISURE: none  PRIOR LEVEL OF FUNCTION: Independent   OBJECTIVE  COGNITION:  Overall cognitive status: Within functional limits for tasks assessed    POSTURE:  Forward head and rounded shoulders posture  UPPER EXTREMITY AROM/PROM:  A/PROM RIGHT   eval   Shoulder extension   Shoulder flexion 163  Shoulder abduction 155  Shoulder internal rotation   Shoulder external rotation     (Blank rows = not tested)  A/PROM LEFT   eval  Shoulder extension 60  Shoulder flexion 160  Shoulder abduction 155  Shoulder internal rotation   Shoulder external rotation 90    (Blank rows = not tested)   LYMPHEDEMA ASSESSMENTS:   LANDMARK RIGHT   eval  10 cm proximal to olecranon process 32.5  Olecranon process 28  10 cm proximal to ulnar styloid process 23.3  Just proximal to ulnar styloid process 17.7  Across hand at thumb web space 20.4  At base of 2nd digit 6.8  (Blank rows = not tested)  LANDMARK LEFT   eval  10 cm proximal to olecranon process 32.8  Olecranon process 28  10 cm proximal to ulnar styloid process 23.1  Just proximal to ulnar styloid process 17.5  Across hand at thumb web space 20.3  At base of 2nd digit 7.3  (Blank rows = not tested)   L-DEX LYMPHEDEMA SCREENING The patient was assessed using the L-Dex machine today to produce a lymphedema index baseline score. The patient will be reassessed on a regular basis  (typically every 3 months) to obtain new L-Dex scores. If the score is > 6.5 points away from his/her baseline score indicating onset of subclinical lymphedema, it will be recommended to wear a compression garment for 4 weeks, 12 hours per day and then be reassessed. If the score continues to be > 6.5 points from baseline at reassessment, we will initiate lymphedema treatment. Assessing in this manner has a 95% rate of preventing clinically significant lymphedema.   L-DEX FLOWSHEETS - 11/29/21 0800       L-DEX LYMPHEDEMA SCREENING   Measurement Type Unilateral    L-DEX MEASUREMENT EXTREMITY Upper Extremity    POSITION  Standing    DOMINANT SIDE Left    At Risk Side Left    BASELINE SCORE (UNILATERAL) -3.9           QUICK DASH SURVEY: 18.18  PATIENT EDUCATION:  Education details: Lymphedema risk reduction and post op shoulder/posture HEP Person educated: Patient Education method: Explanation, Demonstration, Handout Education comprehension: Patient verbalized understanding and returned demonstration   HOME EXERCISE PROGRAM: Patient was instructed today in a home exercise program today for post op shoulder range of motion. These included active assist shoulder flexion in sitting, scapular retraction, wall walking with shoulder abduction, and hands behind head external rotation.  She was encouraged to do these twice a day, holding 3 seconds and repeating 5 times when permitted by her physician.   ASSESSMENT:  CLINICAL IMPRESSION: Her multidisciplinary medical team met prior to her assessments to determine a recommended treatment plan. She is planning to have a left lumpectomy and SLNB. She will benefit from a post op PT reassessment to determine needs and from L-Dex screens every 3 months for 2 years to detect subclinical lymphedema.  Pt will benefit from skilled therapeutic intervention to improve on the following deficits: Decreased knowledge of precautions, impaired UE functional  use, pain, decreased ROM,  postural dysfunction.   PT treatment/interventions: ADL/self-care home management, pt/family education, therapeutic exercise  REHAB POTENTIAL: Excellent  CLINICAL DECISION MAKING: Stable/uncomplicated  EVALUATION COMPLEXITY: Low   GOALS: Goals reviewed with patient? YES  LONG TERM GOALS: (STG=LTG)    Name Target Date Goal status  1 Pt will be able to verbalize understanding of pertinent lymphedema risk reduction practices relevant to her dx specifically related to skin care.  Baseline:  No knowledge 11/29/2021 Achieved at eval  2 Pt will be able to return demo and/or verbalize understanding of the post op HEP related to regaining shoulder ROM. Baseline:  No knowledge 11/29/2021 Achieved at eval  3 Pt will be able to verbalize understanding of the importance of attending the post op After Breast CA Class for further lymphedema risk reduction education and therapeutic exercise.  Baseline:  No knowledge 11/29/2021 Achieved at eval  4 Pt will demo she has regained full shoulder ROM and function post operatively compared to baselines.  Baseline: See objective measurements taken today. 01/24/22      PLAN: PT FREQUENCY/DURATION: EVAL and 1 follow up appointment.   PLAN FOR NEXT SESSION: will reassess 3-4 weeks post op to determine needs.   Patient will follow up at outpatient cancer rehab 3-4 weeks following surgery.  If the patient requires physical therapy at that time, a specific plan will be dictated and sent to the referring physician for approval. The patient was educated today on appropriate basic range of motion exercises to begin post operatively and the importance of attending the After Breast Cancer class following surgery.  Patient was educated today on lymphedema risk reduction practices as it pertains to recommendations that will benefit the patient immediately following surgery.  She verbalized good understanding.    Physical Therapy Information for  After Breast Cancer Surgery/Treatment:  Lymphedema is a swelling condition that you may be at risk for in your arm if you have lymph nodes removed from the armpit area.  After a sentinel node biopsy, the risk is approximately 5-9% and is higher after an axillary node dissection.  There is treatment available for this condition and it is not life-threatening.  Contact your physician or physical therapist with concerns. You may begin the 4 shoulder/posture exercises (see additional sheet) when permitted by your physician (typically a week after surgery).  If you have drains, you may need to wait until those are removed before beginning range of motion exercises.  A general recommendation is to not lift your arms above shoulder height until drains are removed.  These exercises should be done to your tolerance and gently.  This is not a "no pain/no gain" type of recovery so listen to your body and stretch into the range of motion that you can tolerate, stopping if you have pain.  If you are having immediate reconstruction, ask your plastic surgeon about doing exercises as he or she may want you to wait. We encourage you to attend the free one time ABC (After Breast Cancer) class offered by [facility].  You will learn information related to lymphedema risk, prevention and treatment and additional exercises to regain mobility following surgery.  You can call [phone] for more information.  This is offered the 1st and 3rd Monday of each month.  You only attend the class one time. While undergoing any medical procedure or treatment, try to avoid blood pressure being taken or needle sticks from occurring on the arm on the side of cancer.   This recommendation  begins after surgery and continues for the rest of your life.  This may help reduce your risk of getting lymphedema (swelling in your arm). An excellent resource for those seeking information on lymphedema is the National Lymphedema  Network's web site. It can be accessed at [facility] If you notice swelling in your hand, arm or breast at any time following surgery (even if it is many years from now), please contact your doctor or physical therapist to discuss this.  Lymphedema can be treated at any time but it is easier for you if it is treated early on.  If you feel like your shoulder motion is not returning to normal in a reasonable amount of time, please contact your surgeon or physical therapist.  [facility] [phone]. [address], [place] [address]  ABC CLASS After Breast Cancer Class  After Breast Cancer Class is a specially designed exercise class to assist you in a safe recover after having breast cancer surgery.  In this class you will learn how to get back to full function whether your drains were just removed or if you had surgery a month ago.  This one-time class is held the 1st and 3rd Monday of every month from 11:00 a.m. until 12:00 noon virtually.  This class is FREE and space is limited. For more information or to register for the next available class, call [phone].  Class Goals  Understand specific stretches to improve the flexibility of you chest and shoulder. Learn ways to safely strengthen your upper body and improve your posture. Understand the warning signs of infection and why you may be at risk for an arm infection. Learn about Lymphedema and prevention.  ** You do not attend this class until after surgery.  Drains must be removed to participate  Patient was instructed today in a home exercise program today for post op shoulder range of motion. These included active assist shoulder flexion in sitting, scapular retraction, wall walking with shoulder abduction, and hands behind head external rotation.  She was encouraged to do these twice a day, holding 3 seconds and repeating 5 times when permitted by her physician.    Stark Bray, PT 11/29/2021, 8:37 AM

## 2021-11-28 NOTE — Progress Notes (Signed)
Radiation Oncology         [phone] ________________________________  Name: Sara House        MRN: [id number]  Date of Service: 11/29/2021 DOB: 11/19/1955  CC:Consuelo Pandy, MD     REFERRING PHYSICIAN: [name], MD   DIAGNOSIS: There were no encounter diagnoses.   HISTORY OF PRESENT ILLNESS: Sara House is a 66 y.o. female seen at the request of Dr. Marlou Starks for new diagnosis of left breast cancer.  The patient was found on screening mammogram to have a mass in the upper outer quadrant of the left breast.  By ultrasound this was located in the 2:00 position and measured 6 mm and the axilla was negative for adenopathy.  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  Prognostic markers are pending.***She is getting set up for left lumpectomy with sentinel lymph node biopsy.  She is seen today to discuss adjuvant radiotherapy at the appropriate time.   PREVIOUS RADIATION THERAPY: {EXAM; YES/NO:19492::"No"}   PAST MEDICAL HISTORY:  Past Medical History:  Diagnosis Date   History of COVID-19    HLD (hyperlipidemia)    HTN (hypertension)    Sinus congestion        PAST SURGICAL HISTORY: Past Surgical History:  Procedure Laterality Date   DUPUYTREN CONTRACTURE RELEASE     GALLBLADDER SURGERY     LAPAROSCOPIC HYSTERECTOMY       FAMILY HISTORY:  Family History  Problem Relation Age of Onset   CVA Mother    Stroke Mother    Heart failure Mother    Hypertension Mother    CVA Father    Heart disease Father      SOCIAL HISTORY:  reports that she has never smoked. She has never used smokeless tobacco.  The patient is married and lives in [place]. She is accompanied by her husband.  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   ALLERGIES: Cefdinir, Levofloxacin, Penicillins, and Doxycycline   MEDICATIONS:  Current Outpatient Medications  Medication Sig Dispense Refill   ALBUTEROL  SULFATE PO Take 90 mg by mouth.     aspirin EC 81 MG tablet Take 81 mg by mouth daily. Swallow whole.     Budeson-Glycopyrrol-Formoterol (BREZTRI AEROSPHERE) 160-9-4.8 MCG/ACT AERO Inhale 2 puffs into the lungs in the morning and at bedtime. 10.7 g 11   cetirizine (ZYRTEC) 10 MG tablet Take 10 mg by mouth daily.     Cholecalciferol 25 MCG (1000 UT) tablet Take 1,000 Units by mouth daily.     cyanocobalamin 1000 MCG tablet Take 1,000 mcg by mouth daily.     docusate sodium (COLACE) 100 MG capsule Take 100 mg by mouth 2 (two) times daily.     levothyroxine (SYNTHROID) 50 MCG tablet Take 50 mcg by mouth daily before breakfast.     losartan (COZAAR) 50 MG tablet Take 50 mg by mouth daily.     lovastatin (MEVACOR) 40 MG tablet Take 40 mg by mouth at bedtime.     MELATONIN PO Take 10 mg by mouth.     mometasone (NASONEX) 50 MCG/ACT nasal spray Place 2 sprays into the nose daily. 1 each 11   montelukast (SINGULAIR) 10 MG tablet Take 1 tablet by mouth daily.     Omega-3 Fatty Acids (FISH OIL) 1000 MG CAPS Take 2,000 mg by mouth.     OMEPRAZOLE PO Take 40 mg by mouth.  propranolol (INDERAL) 80 MG tablet Take 80 mg by mouth daily at 2 PM. 1 time daily     No current facility-administered medications for this visit.     REVIEW OF SYSTEMS: On review of systems, the patient reports that she is doing ***     PHYSICAL EXAM:  Wt Readings from Last 3 Encounters:  07/04/21 176 lb 3.2 oz (79.9 kg)  12/30/20 168 lb 9.6 oz (76.5 kg)  09/29/20 165 lb 3.2 oz (74.9 kg)   Temp Readings from Last 3 Encounters:  07/04/21 97.9 F (36.6 C) (Oral)  12/30/20 98.4 F (36.9 C) (Oral)   BP Readings from Last 3 Encounters:  07/04/21 120/80  12/30/20 128/78  09/29/20 110/76   Pulse Readings from Last 3 Encounters:  07/04/21 (!) 58  12/30/20 (!) 58  09/29/20 64    In general this is a well appearing *** female in no acute distress. She's alert and oriented x4 and appropriate throughout the examination.  Cardiopulmonary assessment is negative for acute distress and she exhibits normal effort. Bilateral breast exam is deferred.    ECOG = ***  0 - Asymptomatic (Fully active, able to carry on all predisease activities without restriction)  1 - Symptomatic but completely ambulatory (Restricted in physically strenuous activity but ambulatory and able to carry out work of a light or sedentary nature. For example, light housework, office work)  2 - Symptomatic, <50% in bed during the day (Ambulatory and capable of all self care but unable to carry out any work activities. Up and about more than 50% of waking hours)  3 - Symptomatic, >50% in bed, but not bedbound (Capable of only limited self-care, confined to bed or chair 50% or more of waking hours)  4 - Bedbound (Completely disabled. Cannot carry on any self-care. Totally confined to bed or chair)  5 - Death   Eustace Pen MM, Creech RH, Tormey DC, et al. [phone]). "Toxicity and response criteria of the [facility]". [place] Oncol. 5 (6): 649-55    LABORATORY DATA:  Lab Results  Component Value Date   WBC 6.4 10/21/2008   HGB 14.0 10/21/2008   HCT 42.6 10/21/2008   MCV 88.3 10/21/2008   PLT 201 10/21/2008   Lab Results  Component Value Date   NA 140 10/21/2008   K 4.4 10/21/2008   CL 106 10/21/2008   CO2 28 10/21/2008   No results found for: "ALT", "AST", "GGT", "ALKPHOS", "BILITOT"    RADIOGRAPHY: MM CLIP PLACEMENT LEFT  Result Date: 11/22/2021 CLINICAL DATA:  Evaluate biopsy marker EXAM: 3D DIAGNOSTIC LEFT MAMMOGRAM POST ULTRASOUND BIOPSY COMPARISON:  Previous exam(s). FINDINGS: 3D Mammographic images were obtained following ultrasound guided biopsy of a left breast mass. The biopsy marking clip is in expected position at the site of biopsy. IMPRESSION: Appropriate positioning of the ribbon shaped biopsy marking clip at the site of biopsy in the biopsied left breast mass. Final Assessment: Post Procedure  Mammograms for Marker Placement Electronically Signed   By: Dorise Bullion III M.D.   On: 11/22/2021 11:39  [place] LT BREAST BX W LOC DEV 1ST LESION IMG BX SPEC US GUIDE  Result Date: 11/22/2021 CLINICAL DATA:  Biopsy of a left breast mass EXAM: ULTRASOUND GUIDED LEFT BREAST CORE NEEDLE BIOPSY COMPARISON:  Previous exam(s). PROCEDURE: I met with the patient and we discussed the procedure of ultrasound-guided biopsy, including benefits and alternatives. We discussed the high likelihood of a successful procedure. We discussed the risks of the  procedure, including infection, bleeding, tissue injury, clip migration, and inadequate sampling. Informed written consent was given. The usual time-out protocol was performed immediately prior to the procedure. Lesion quadrant: 2 o'clock left breast Using sterile technique and 1% Lidocaine as local anesthetic, under direct ultrasound visualization, a 12 gauge spring-loaded device was used to perform biopsy of a 2 o'clock left breast mass using a lateral approach. At the conclusion of the procedure a ribbon shaped tissue marker clip was deployed into the biopsy cavity. Follow up 2 view mammogram was performed and dictated separately. IMPRESSION: Ultrasound guided biopsy of a 2 o'clock left breast mass. No apparent complications. Electronically Signed   By: Gerome Sam III M.D.   On: 11/22/2021 11:14  MM DIAG BREAST TOMO UNI LEFT  Result Date: 11/16/2021 CLINICAL DATA:  Patient returns after screening study for evaluation of possible LEFT breast asymmetry. The patient's sister was diagnosed with breast cancer. EXAM: DIGITAL DIAGNOSTIC UNILATERAL LEFT MAMMOGRAM WITH TOMOSYNTHESIS; ULTRASOUND LEFT BREAST LIMITED TECHNIQUE: Left digital diagnostic mammography and breast tomosynthesis was performed.; Targeted ultrasound examination of the left breast was performed. COMPARISON:  Previous exam(s). ACR Breast Density Category b: There are scattered areas of fibroglandular  density. FINDINGS: Additional 2-D and 3-D images are performed. These views confirm presence of a spiculated mass in the LATERAL portion of the LEFT breast. Targeted ultrasound is performed, showing an irregular mixed echogenicity mass with dense posterior acoustic shadowing in the 2 o'clock location of the LEFT breast 6 centimeters from the nipple. Mass measures 0.3 x 0.6 x 0 4 centimeters. No internal blood flow by Doppler evaluation. Evaluation of the LEFT axilla is negative for adenopathy. IMPRESSION: Suspicious mass in the 2 o'clock location of the LEFT breast. No LEFT axillary adenopathy. RECOMMENDATION: Ultrasound-guided core biopsy of LEFT breast mass. I have discussed the findings and recommendations with the patient and her sister. If applicable, a reminder letter will be sent to the patient regarding the next appointment. BI-RADS CATEGORY  4: Suspicious. Electronically Signed   By: Norva Pavlov M.D.   On: 11/16/2021 11:59  US BREAST LTD UNI LEFT INC AXILLA  Result Date: 11/16/2021 CLINICAL DATA:  Patient returns after screening study for evaluation of possible LEFT breast asymmetry. The patient's sister was diagnosed with breast cancer. EXAM: DIGITAL DIAGNOSTIC UNILATERAL LEFT MAMMOGRAM WITH TOMOSYNTHESIS; ULTRASOUND LEFT BREAST LIMITED TECHNIQUE: Left digital diagnostic mammography and breast tomosynthesis was performed.; Targeted ultrasound examination of the left breast was performed. COMPARISON:  Previous exam(s). ACR Breast Density Category b: There are scattered areas of fibroglandular density. FINDINGS: Additional 2-D and 3-D images are performed. These views confirm presence of a spiculated mass in the LATERAL portion of the LEFT breast. Targeted ultrasound is performed, showing an irregular mixed echogenicity mass with dense posterior acoustic shadowing in the 2 o'clock location of the LEFT breast 6 centimeters from the nipple. Mass measures 0.3 x 0.6 x 0 4 centimeters. No internal blood  flow by Doppler evaluation. Evaluation of the LEFT axilla is negative for adenopathy. IMPRESSION: Suspicious mass in the 2 o'clock location of the LEFT breast. No LEFT axillary adenopathy. RECOMMENDATION: Ultrasound-guided core biopsy of LEFT breast mass. I have discussed the findings and recommendations with the patient and her sister. If applicable, a reminder letter will be sent to the patient regarding the next appointment. BI-RADS CATEGORY  4: Suspicious. Electronically Signed   By: Norva Pavlov M.D.   On: 11/16/2021 11:59  MM 3D SCREEN BREAST BILATERAL  Result Date: 11/07/2021 CLINICAL DATA:  Screening. EXAM: DIGITAL SCREENING BILATERAL MAMMOGRAM WITH TOMOSYNTHESIS AND CAD TECHNIQUE: Bilateral screening digital craniocaudal and mediolateral oblique mammograms were obtained. Bilateral screening digital breast tomosynthesis was performed. The images were evaluated with computer-aided detection. COMPARISON:  Previous exam(s). ACR Breast Density Category b: There are scattered areas of fibroglandular density. FINDINGS: In the left breast, a possible asymmetry warrants further evaluation. In the right breast, no findings suspicious for malignancy. IMPRESSION: Further evaluation is suggested for possible asymmetry in the left breast. RECOMMENDATION: Diagnostic mammogram and possibly ultrasound of the left breast. (Code:[id number]-L-17M) The patient will be contacted regarding the findings, and additional imaging will be scheduled. BI-RADS CATEGORY  0: Incomplete. Need additional imaging evaluation and/or prior mammograms for comparison. Electronically Signed   By: Ileana Roup M.D.   On: 11/07/2021 15:37       IMPRESSION/PLAN: 1. Stage IA, cT1bN0M0, grade 1, ***invasive ductal carcinoma of the left breast. Dr. Lisbeth Renshaw discusses the pathology findings and reviews the nature of left breast disease.  She is planning to undergo breast conservation with lumpectomy with *** sentinel node biopsy. Depending on the size of  the final tumor measurements rendered by pathology, the tumor may be tested for Oncotype Dx score to determine a role for systemic therapy. Provided that chemotherapy is not indicated, the patient's course would then be followed by external radiotherapy to the breast  to reduce risks of local recurrence followed by antiestrogen therapy. We discussed the risks, benefits, short, and long term effects of radiotherapy, as well as the curative intent, and the patient is interested in proceeding. Dr. Lisbeth Renshaw discusses the delivery and logistics of radiotherapy and anticipates a course of 4 or 6 1/2 weeks of radiotherapy to the left breast with deep inspiration breath-hold technique. We will see her back a few weeks after surgery to discuss the simulation process and anticipate we starting radiotherapy about 4-6 weeks after surgery.  2. Possible genetic predisposition to malignancy. The patient is a candidate for genetic testing given her personal and family history. She will meet with our geneticist today in clinic.   In a visit lasting *** minutes, greater than 50% of the time was spent face to face reviewing her case, as well as in preparation of, discussing, and coordinating the patient's care.  The above documentation reflects my direct findings during this shared patient visit. Please see the separate note by Dr. Lisbeth Renshaw on this date for the remainder of the patient's plan of care.    Carola Rhine, [facility]    **Disclaimer: This note was dictated with voice recognition software. Similar sounding words can inadvertently be transcribed and this note may contain transcription errors which may not have been corrected upon publication of note.**

## 2021-11-28 NOTE — Telephone Encounter (Signed)
Scheduled appt per 9/11 referral. Pt is aware of appt date and time. Pt is aware to arrive 15 mins prior to appt time and to bring and updated insurance card. Pt is aware of appt location.   

## 2021-11-29 ENCOUNTER — Ambulatory Visit
Admission: RE | Admit: 2021-11-29 | Discharge: 2021-11-29 | Disposition: A | Discharge status: Home / Self Care | Payer: Medicare Other | Source: Ambulatory Visit | Attending: Radiation Oncology | Admitting: Radiation Oncology

## 2021-11-29 ENCOUNTER — Encounter: Payer: Self-pay | Admitting: Rehabilitation

## 2021-11-29 ENCOUNTER — Other Ambulatory Visit: Payer: Self-pay | Admitting: General Surgery

## 2021-11-29 ENCOUNTER — Other Ambulatory Visit: Payer: Self-pay

## 2021-11-29 ENCOUNTER — Encounter: Payer: Self-pay | Admitting: Radiation Oncology

## 2021-11-29 ENCOUNTER — Ambulatory Visit: Payer: Medicare Other | Attending: General Surgery | Admitting: Rehabilitation

## 2021-11-29 DIAGNOSIS — R293 Abnormal posture: Secondary | ICD-10-CM | POA: Insufficient documentation

## 2021-11-29 DIAGNOSIS — Z170 Estrogen receptor positive status [ER+]: Secondary | ICD-10-CM | POA: Diagnosis not present

## 2021-11-29 DIAGNOSIS — E785 Hyperlipidemia, unspecified: Secondary | ICD-10-CM | POA: Diagnosis not present

## 2021-11-29 DIAGNOSIS — I10 Essential (primary) hypertension: Secondary | ICD-10-CM | POA: Diagnosis not present

## 2021-11-29 DIAGNOSIS — Z8049 Family history of malignant neoplasm of other genital organs: Secondary | ICD-10-CM | POA: Diagnosis not present

## 2021-11-29 DIAGNOSIS — Z79899 Other long term (current) drug therapy: Secondary | ICD-10-CM | POA: Insufficient documentation

## 2021-11-29 DIAGNOSIS — C50412 Malignant neoplasm of upper-outer quadrant of left female breast: Secondary | ICD-10-CM

## 2021-11-29 DIAGNOSIS — Z7951 Long term (current) use of inhaled steroids: Secondary | ICD-10-CM | POA: Insufficient documentation

## 2021-11-29 DIAGNOSIS — Z8616 Personal history of COVID-19: Secondary | ICD-10-CM | POA: Diagnosis not present

## 2021-11-29 DIAGNOSIS — Z79890 Hormone replacement therapy (postmenopausal): Secondary | ICD-10-CM | POA: Diagnosis not present

## 2021-11-29 DIAGNOSIS — Z7982 Long term (current) use of aspirin: Secondary | ICD-10-CM | POA: Diagnosis not present

## 2021-11-29 DIAGNOSIS — Z803 Family history of malignant neoplasm of breast: Secondary | ICD-10-CM | POA: Insufficient documentation

## 2021-12-05 ENCOUNTER — Encounter: Payer: Self-pay | Admitting: Hematology and Oncology

## 2021-12-05 ENCOUNTER — Telehealth: Payer: Self-pay | Admitting: *Deleted

## 2021-12-05 ENCOUNTER — Encounter: Payer: Self-pay | Admitting: *Deleted

## 2021-12-05 ENCOUNTER — Inpatient Hospital Stay: Payer: Medicare Other | Attending: Hematology and Oncology | Admitting: Hematology and Oncology

## 2021-12-05 ENCOUNTER — Inpatient Hospital Stay: Payer: Medicare Other

## 2021-12-05 ENCOUNTER — Other Ambulatory Visit: Payer: Self-pay

## 2021-12-05 DIAGNOSIS — J45909 Unspecified asthma, uncomplicated: Secondary | ICD-10-CM | POA: Insufficient documentation

## 2021-12-05 DIAGNOSIS — Z8049 Family history of malignant neoplasm of other genital organs: Secondary | ICD-10-CM | POA: Insufficient documentation

## 2021-12-05 DIAGNOSIS — Z8249 Family history of ischemic heart disease and other diseases of the circulatory system: Secondary | ICD-10-CM | POA: Diagnosis not present

## 2021-12-05 DIAGNOSIS — C50412 Malignant neoplasm of upper-outer quadrant of left female breast: Secondary | ICD-10-CM | POA: Diagnosis present

## 2021-12-05 DIAGNOSIS — Z8616 Personal history of COVID-19: Secondary | ICD-10-CM | POA: Diagnosis not present

## 2021-12-05 DIAGNOSIS — E039 Hypothyroidism, unspecified: Secondary | ICD-10-CM | POA: Diagnosis not present

## 2021-12-05 DIAGNOSIS — Z880 Allergy status to penicillin: Secondary | ICD-10-CM | POA: Insufficient documentation

## 2021-12-05 DIAGNOSIS — Z170 Estrogen receptor positive status [ER+]: Secondary | ICD-10-CM | POA: Diagnosis not present

## 2021-12-05 DIAGNOSIS — Z881 Allergy status to other antibiotic agents status: Secondary | ICD-10-CM | POA: Diagnosis not present

## 2021-12-05 DIAGNOSIS — Z803 Family history of malignant neoplasm of breast: Secondary | ICD-10-CM | POA: Insufficient documentation

## 2021-12-05 DIAGNOSIS — Z823 Family history of stroke: Secondary | ICD-10-CM | POA: Diagnosis not present

## 2021-12-05 DIAGNOSIS — E785 Hyperlipidemia, unspecified: Secondary | ICD-10-CM | POA: Insufficient documentation

## 2021-12-05 DIAGNOSIS — I10 Essential (primary) hypertension: Secondary | ICD-10-CM | POA: Insufficient documentation

## 2021-12-05 DIAGNOSIS — Z79890 Hormone replacement therapy (postmenopausal): Secondary | ICD-10-CM | POA: Insufficient documentation

## 2021-12-05 DIAGNOSIS — Z79899 Other long term (current) drug therapy: Secondary | ICD-10-CM | POA: Insufficient documentation

## 2021-12-05 NOTE — Telephone Encounter (Signed)
Called pt regarding navigation resources and contact information. No answer left vm with information. Request return call with questions or needs.

## 2021-12-05 NOTE — Progress Notes (Signed)
[facility] NOTE  Patient Care Team: Merwyn Katos as PCP - General (Physician Assistant)  CHIEF COMPLAINTS/PURPOSE OF CONSULTATION:  Newly diagnosed breast cancer  HISTORY OF PRESENTING ILLNESS:  Sara House 66 y.o. female is here because of recent diagnosis of left breast cancer  I reviewed her records extensively and collaborated the history with the patient.  SUMMARY OF ONCOLOGIC HISTORY: Oncology History  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 ([place])  11/03/2021 Mammogram   Screening mammogram showed possible asymmetry in the left breast warranting further evaluation.  No suspicious findings in the right breast.  Diagnostic mammogram of the left confirmed irregular mixed echogenicity mass with dense posterior caustic shadowing in the 2:00 location of the left breast 6 cm from the nipple measuring about 0.3 x 0.6 x 0.4 cm.  Left axilla is negative for adenopathy   11/22/2021 Pathology Results   Left breast needle core biopsy showed invasive ductal carcinoma, grade 1 along with focal DCIS, no evidence of lymphovascular invasion.  Prognostic showed ER +100% strong staining, PR 85% positive strong staining, Ki-67 of 5% and HER2 0 by [facility]   11/28/2021 Initial Diagnosis   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 ([place])   11/28/2021 Cancer Staging   Staging form: Breast, AJCC 8th Edition - Clinical stage from 11/28/2021: Stage IA (cT1b, cN0, cM0, G1, ER+, PR+, HER2-) - Signed by Hayden Pedro, PA-C on 11/29/2021 Stage prefix: Initial diagnosis Method of lymph node assessment: Clinical Histologic grading system: 3 grade system    She is here for an initial visit with her husband.  She admits to past medical history of hypertension, dyslipidemia and hypothyroidism.  She has not been doing mammograms regularly, last mammogram before the August 1 was 9 years ago.  Sister had  breast cancer in her early 35s.  Mom had uterine cancer.  She otherwise reports new diagnosis of asthma since she had COVID-19. Rest of the pertinent 10 point ROS reviewed and negative  MEDICAL HISTORY:  Past Medical History:  Diagnosis Date   Breast cancer ([place]) 11/22/2021   History of COVID-19    HLD (hyperlipidemia)    HTN (hypertension)    Sinus congestion     SURGICAL HISTORY: Past Surgical History:  Procedure Laterality Date   DUPUYTREN CONTRACTURE RELEASE     GALLBLADDER SURGERY     LAPAROSCOPIC HYSTERECTOMY      SOCIAL HISTORY: Social History   Socioeconomic History   Marital status: Married    Spouse name: Not on file   Number of children: Not on file   Years of education: Not on file   Highest education level: Not on file  Occupational History   Not on file  Tobacco Use   Smoking status: Never   Smokeless tobacco: Never  Substance and Sexual Activity   Alcohol use: Never   Drug use: Not on file   Sexual activity: Not on file  Other Topics Concern   Not on file  Social History Narrative   Not on file   Social Determinants of Health   Financial Resource Strain: Not on file  Food Insecurity: Not on file  Transportation Needs: Not on file  Physical Activity: Not on file  Stress: Not on file  Social Connections: Not on file  Intimate Partner Violence: Not on file    FAMILY HISTORY: Family History  Problem Relation Age of Onset   CVA Mother  Stroke Mother    Heart failure Mother    Hypertension Mother    Uterine cancer Mother    CVA Father    Heart disease Father    Breast cancer Sister     ALLERGIES:  is allergic to cefdinir, levofloxacin, penicillins, and doxycycline.  MEDICATIONS:  Current Outpatient Medications  Medication Sig Dispense Refill   ALBUTEROL SULFATE PO Take 90 mg by mouth.     aspirin EC 81 MG tablet Take 81 mg by mouth daily. Swallow whole.     Budeson-Glycopyrrol-Formoterol (BREZTRI AEROSPHERE) 160-9-4.8 MCG/ACT AERO  Inhale 2 puffs into the lungs in the morning and at bedtime. 10.7 g 11   cetirizine (ZYRTEC) 10 MG tablet Take 10 mg by mouth daily.     Cholecalciferol 25 MCG (1000 UT) tablet Take 1,000 Units by mouth daily.     cyanocobalamin 1000 MCG tablet Take 1,000 mcg by mouth daily.     docusate sodium (COLACE) 100 MG capsule Take 100 mg by mouth 2 (two) times daily.     levothyroxine (SYNTHROID) 50 MCG tablet Take 50 mcg by mouth daily before breakfast.     losartan (COZAAR) 50 MG tablet Take 50 mg by mouth daily.     lovastatin (MEVACOR) 40 MG tablet Take 40 mg by mouth at bedtime.     MELATONIN PO Take 10 mg by mouth.     mometasone (NASONEX) 50 MCG/ACT nasal spray Place 2 sprays into the nose daily. 1 each 11   montelukast (SINGULAIR) 10 MG tablet Take 1 tablet by mouth daily.     Omega-3 Fatty Acids (FISH OIL) 1000 MG CAPS Take 2,000 mg by mouth.     OMEPRAZOLE PO Take 40 mg by mouth.     propranolol (INDERAL) 80 MG tablet Take 80 mg by mouth daily at 2 PM. 1 time daily     No current facility-administered medications for this visit.    REVIEW OF SYSTEMS:   Constitutional: Denies fevers, chills or abnormal night sweats Eyes: Denies blurriness of vision, double vision or watery eyes Ears, nose, mouth, throat, and face: Denies mucositis or sore throat Respiratory: Denies cough, dyspnea or wheezes Cardiovascular: Denies palpitation, chest discomfort or lower extremity swelling Gastrointestinal:  Denies nausea, heartburn or change in bowel habits Skin: Denies abnormal skin rashes Lymphatics: Denies new lymphadenopathy or easy bruising Neurological:Denies numbness, tingling or new weaknesses Behavioral/Psych: Mood is stable, no new changes  Breast: Denies any palpable lumps or discharge All other systems were reviewed with the patient and are negative.  PHYSICAL EXAMINATION: ECOG PERFORMANCE STATUS: 0 - Asymptomatic  Vitals:   12/05/21 1052  BP: (!) 143/83  Pulse: (!) 55  Resp: 16   Temp: 98.1 F (36.7 C)  SpO2: 99%   Filed Weights   12/05/21 1052  Weight: 177 lb 12.8 oz (80.6 kg)    GENERAL:alert, no distress and comfortable SKIN: skin color, texture, turgor are normal, no rashes or significant lesions EYES: normal, conjunctiva are pink and non-injected, sclera clear OROPHARYNX:no exudate, no erythema and lips, buccal mucosa, and tongue normal  NECK: supple, thyroid normal size, non-tender, without nodularity LYMPH:  no palpable lymphadenopathy in the cervical, axillary or inguinal LUNGS: clear to auscultation and percussion with normal breathing effort HEART: regular rate & rhythm and no murmurs and no lower extremity edema ABDOMEN:abdomen soft, non-tender and normal bowel sounds Musculoskeletal:no cyanosis of digits and no clubbing  PSYCH: alert & oriented x 3 with fluent speech NEURO: no focal motor/sensory deficits BREAST: No  definite palpable breast masses.  No regional adenopathy.  Bruising noted in the left breast at the site of biopsy  LABORATORY DATA:  I have reviewed the data as listed Lab Results  Component Value Date   WBC 6.4 10/21/2008   HGB 14.0 10/21/2008   HCT 42.6 10/21/2008   MCV 88.3 10/21/2008   PLT 201 10/21/2008   Lab Results  Component Value Date   NA 140 10/21/2008   K 4.4 10/21/2008   CL 106 10/21/2008   CO2 28 10/21/2008    RADIOGRAPHIC STUDIES: I have personally reviewed the radiological reports and agreed with the findings in the report.  ASSESSMENT AND PLAN:  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 [facility]) This is a very pleasant 66 year old postmenopausal female patient with left breast invasive ductal carcinoma, grade 1, ER/PR strongly positive, HER2 0, Ki-67 of 5% referred to breast clinic for further recommendations.  We have discussed the pathology results in detail today.  We have discussed the role of Oncotype to determine benefit from adjuvant chemotherapy.  Given small tumor,  low-grade, strong ER/PR positivity and low proliferation index, I do not believe she will need adjuvant chemotherapy.  She is however willing to proceed with Oncotype testing. She most likely will proceed with adjuvant radiation after surgery followed by antiestrogen therapy.  We have discussed the following details about antiestrogen therapy.  With regards to Tamoxifen, we discussed that this is a SERM, selective estrogen receptor modulator. We discussed mechanism of action of Tamoxifen, adverse effects on Tamoxifen including but not limited to post menopausal symptoms, increased risk of DVT/PE, increased risk of endometrial cancer, questionable cataracts with long term use and increased risk of cardiovascular events in the study which was not statistically significant. A benefit from Tamoxifen would be improvement in bone density.  With regards to aromatase inhibitors, we discussed mechanism of action, adverse effects including but not limited to post menopausal symptoms, arthralgias, myalgias, increased risk of cardiovascular events and bone loss.   She is a bit worried about the risk of blood clots with tamoxifen hence would like to consider aromatase inhibitors for antiestrogen therapy  She will return to clinic after surgery to discuss further recommendations  Total time spent: 45 minutes All questions were answered. The patient knows to call the clinic with any problems, questions or concerns.    Benay Pike, MD 12/05/21

## 2021-12-05 NOTE — Assessment & Plan Note (Signed)
This is a very pleasant 66 year old postmenopausal female patient with left breast invasive ductal carcinoma, grade 1, ER/PR strongly positive, HER2 0, Ki-67 of 5% referred to breast clinic for further recommendations.  We have discussed the pathology results in detail today.  We have discussed the role of Oncotype to determine benefit from adjuvant chemotherapy.  Given small tumor, low-grade, strong ER/PR positivity and low proliferation index, I do not believe she will need adjuvant chemotherapy.  She is however willing to proceed with Oncotype testing. She most likely will proceed with adjuvant radiation after surgery followed by antiestrogen therapy.  We have discussed the following details about antiestrogen therapy.  With regards to Tamoxifen, we discussed that this is a SERM, selective estrogen receptor modulator. We discussed mechanism of action of Tamoxifen, adverse effects on Tamoxifen including but not limited to post menopausal symptoms, increased risk of DVT/PE, increased risk of endometrial cancer, questionable cataracts with long term use and increased risk of cardiovascular events in the study which was not statistically significant. A benefit from Tamoxifen would be improvement in bone density.  With regards to aromatase inhibitors, we discussed mechanism of action, adverse effects including but not limited to post menopausal symptoms, arthralgias, myalgias, increased risk of cardiovascular events and bone loss.   She is a bit worried about the risk of blood clots with tamoxifen hence would like to consider aromatase inhibitors for antiestrogen therapy  She will return to clinic after surgery to discuss further recommendations

## 2021-12-06 ENCOUNTER — Other Ambulatory Visit: Payer: Self-pay

## 2021-12-06 ENCOUNTER — Encounter (HOSPITAL_BASED_OUTPATIENT_CLINIC_OR_DEPARTMENT_OTHER): Payer: Self-pay | Admitting: General Surgery

## 2021-12-06 ENCOUNTER — Telehealth: Payer: Self-pay | Admitting: Hematology and Oncology

## 2021-12-06 NOTE — Telephone Encounter (Signed)
Scheduled appointment per 9/18 los. Left voicemail.

## 2021-12-09 ENCOUNTER — Encounter (HOSPITAL_BASED_OUTPATIENT_CLINIC_OR_DEPARTMENT_OTHER)
Admission: RE | Admit: 2021-12-09 | Discharge: 2021-12-09 | Disposition: A | Discharge status: Home / Self Care | Payer: Medicare Other | Source: Ambulatory Visit | Attending: General Surgery | Admitting: General Surgery

## 2021-12-09 DIAGNOSIS — Z01810 Encounter for preprocedural cardiovascular examination: Secondary | ICD-10-CM | POA: Diagnosis present

## 2021-12-09 DIAGNOSIS — I10 Essential (primary) hypertension: Secondary | ICD-10-CM | POA: Diagnosis not present

## 2021-12-09 NOTE — Progress Notes (Signed)

## 2021-12-13 ENCOUNTER — Telehealth: Payer: Self-pay | Admitting: Licensed Clinical Social Worker

## 2021-12-13 ENCOUNTER — Ambulatory Visit
Admission: RE | Admit: 2021-12-13 | Discharge: 2021-12-13 | Disposition: A | Discharge status: Home / Self Care | Payer: Medicare Other | Source: Ambulatory Visit | Attending: General Surgery | Admitting: General Surgery

## 2021-12-13 DIAGNOSIS — C50412 Malignant neoplasm of upper-outer quadrant of left female breast: Secondary | ICD-10-CM

## 2021-12-13 NOTE — Telephone Encounter (Signed)
[facility] Clinical Social Work  Clinical Social Work was referred by new patient protocol for assessment of psychosocial needs.  Clinical Social Worker attempted to contact patient by phone  to offer support and assess for needs.   No answer. Left VM with direct contact information.      Genaro Bekker E Barbie Croston, LCSW  Clinical Social Worker [facility]        

## 2021-12-14 ENCOUNTER — Ambulatory Visit
Admission: RE | Admit: 2021-12-14 | Discharge: 2021-12-14 | Disposition: A | Discharge status: Home / Self Care | Payer: Medicare Other | Source: Ambulatory Visit | Attending: General Surgery | Admitting: General Surgery

## 2021-12-14 ENCOUNTER — Ambulatory Visit (HOSPITAL_BASED_OUTPATIENT_CLINIC_OR_DEPARTMENT_OTHER): Payer: Medicare Other | Admitting: Certified Registered"

## 2021-12-14 ENCOUNTER — Other Ambulatory Visit: Payer: Self-pay

## 2021-12-14 ENCOUNTER — Encounter (HOSPITAL_BASED_OUTPATIENT_CLINIC_OR_DEPARTMENT_OTHER): Payer: Self-pay | Admitting: General Surgery

## 2021-12-14 ENCOUNTER — Encounter (HOSPITAL_BASED_OUTPATIENT_CLINIC_OR_DEPARTMENT_OTHER)
Admission: RE | Disposition: A | Discharge status: Home / Self Care | Payer: Self-pay | Source: Ambulatory Visit | Attending: General Surgery

## 2021-12-14 ENCOUNTER — Ambulatory Visit (HOSPITAL_BASED_OUTPATIENT_CLINIC_OR_DEPARTMENT_OTHER)
Admission: RE | Admit: 2021-12-14 | Discharge: 2021-12-14 | Disposition: A | Discharge status: Home / Self Care | Payer: Medicare Other | Source: Ambulatory Visit | Attending: General Surgery | Admitting: General Surgery

## 2021-12-14 DIAGNOSIS — N6082 Other benign mammary dysplasias of left breast: Secondary | ICD-10-CM | POA: Diagnosis not present

## 2021-12-14 DIAGNOSIS — C50912 Malignant neoplasm of unspecified site of left female breast: Secondary | ICD-10-CM | POA: Diagnosis not present

## 2021-12-14 DIAGNOSIS — C50412 Malignant neoplasm of upper-outer quadrant of left female breast: Secondary | ICD-10-CM | POA: Insufficient documentation

## 2021-12-14 DIAGNOSIS — Z803 Family history of malignant neoplasm of breast: Secondary | ICD-10-CM | POA: Diagnosis not present

## 2021-12-14 DIAGNOSIS — I10 Essential (primary) hypertension: Secondary | ICD-10-CM

## 2021-12-14 DIAGNOSIS — Z170 Estrogen receptor positive status [ER+]: Secondary | ICD-10-CM | POA: Insufficient documentation

## 2021-12-14 HISTORY — PX: BREAST LUMPECTOMY WITH RADIOACTIVE SEED AND SENTINEL LYMPH NODE BIOPSY: SHX6550

## 2021-12-14 HISTORY — DX: Hypothyroidism, unspecified: E03.9

## 2021-12-14 HISTORY — DX: Gastro-esophageal reflux disease without esophagitis: K21.9

## 2021-12-14 HISTORY — DX: Unspecified asthma, uncomplicated: J45.909

## 2021-12-14 HISTORY — DX: Allergy, unspecified, initial encounter: T78.40XA

## 2021-12-14 SURGERY — BREAST LUMPECTOMY WITH RADIOACTIVE SEED AND SENTINEL LYMPH NODE BIOPSY
Anesthesia: General | Site: Breast | Laterality: Left

## 2021-12-14 MED ORDER — PROPOFOL 10 MG/ML IV BOLUS
INTRAVENOUS | Status: AC
  Filled 2021-12-14: qty 20

## 2021-12-14 MED ORDER — LIDOCAINE HCL (CARDIAC) PF 100 MG/5ML IV SOSY
PREFILLED_SYRINGE | INTRAVENOUS | Status: DC | PRN
  Administered 2021-12-14: 60 mg via INTRAVENOUS

## 2021-12-14 MED ORDER — BUPIVACAINE-EPINEPHRINE (PF) 0.5% -1:200000 IJ SOLN
INTRAMUSCULAR | Status: DC | PRN
  Administered 2021-12-14: 30 mL via PERINEURAL

## 2021-12-14 MED ORDER — ACETAMINOPHEN 500 MG PO TABS
1000.0000 mg | ORAL_TABLET | ORAL | Status: AC
  Administered 2021-12-14: 1000 mg via ORAL

## 2021-12-14 MED ORDER — FENTANYL CITRATE (PF) 100 MCG/2ML IJ SOLN
INTRAMUSCULAR | Status: AC
  Filled 2021-12-14: qty 2

## 2021-12-14 MED ORDER — DEXAMETHASONE SODIUM PHOSPHATE 10 MG/ML IJ SOLN
INTRAMUSCULAR | Status: DC | PRN
  Administered 2021-12-14: 10 mg via INTRAVENOUS

## 2021-12-14 MED ORDER — CHLORHEXIDINE GLUCONATE CLOTH 2 % EX PADS: 6.0000 | MEDICATED_PAD | Freq: Once | CUTANEOUS | Status: DC

## 2021-12-14 MED ORDER — VANCOMYCIN HCL 1000 MG IV SOLR
INTRAVENOUS | Status: DC | PRN
  Administered 2021-12-14: 1000 mg via INTRAVENOUS

## 2021-12-14 MED ORDER — ONDANSETRON HCL 4 MG/2ML IJ SOLN
INTRAMUSCULAR | Status: DC | PRN
  Administered 2021-12-14: 4 mg via INTRAVENOUS

## 2021-12-14 MED ORDER — OXYCODONE HCL 5 MG PO TABS: 5.0000 mg | ORAL_TABLET | Freq: Four times a day (QID) | ORAL | 0 refills | Status: DC | PRN

## 2021-12-14 MED ORDER — FENTANYL CITRATE (PF) 100 MCG/2ML IJ SOLN
25.0000 ug | INTRAMUSCULAR | Status: DC | PRN
  Administered 2021-12-14 (×2): 25 ug via INTRAVENOUS

## 2021-12-14 MED ORDER — LACTATED RINGERS IV SOLN: INTRAVENOUS | Status: DC

## 2021-12-14 MED ORDER — VANCOMYCIN HCL IN DEXTROSE 1-5 GM/200ML-% IV SOLN
INTRAVENOUS | Status: AC
  Filled 2021-12-14: qty 200

## 2021-12-14 MED ORDER — CELECOXIB 200 MG PO CAPS
200.0000 mg | ORAL_CAPSULE | ORAL | Status: AC
  Administered 2021-12-14: 200 mg via ORAL

## 2021-12-14 MED ORDER — EPHEDRINE 5 MG/ML INJ
INTRAVENOUS | Status: AC
  Filled 2021-12-14: qty 5

## 2021-12-14 MED ORDER — MIDAZOLAM HCL 2 MG/2ML IJ SOLN
1.0000 mg | Freq: Once | INTRAMUSCULAR | Status: AC
  Administered 2021-12-14: 1 mg via INTRAVENOUS

## 2021-12-14 MED ORDER — MIDAZOLAM HCL 2 MG/2ML IJ SOLN
INTRAMUSCULAR | Status: AC
  Filled 2021-12-14: qty 2

## 2021-12-14 MED ORDER — DEXAMETHASONE SODIUM PHOSPHATE 10 MG/ML IJ SOLN
INTRAMUSCULAR | Status: AC
  Filled 2021-12-14: qty 1

## 2021-12-14 MED ORDER — FENTANYL CITRATE (PF) 100 MCG/2ML IJ SOLN
50.0000 ug | Freq: Once | INTRAMUSCULAR | Status: AC
  Administered 2021-12-14: 50 ug via INTRAVENOUS

## 2021-12-14 MED ORDER — MAGTRACE LYMPHATIC TRACER
INTRAMUSCULAR | Status: DC | PRN
  Administered 2021-12-14: 2 mL via INTRAMUSCULAR

## 2021-12-14 MED ORDER — GABAPENTIN 300 MG PO CAPS
300.0000 mg | ORAL_CAPSULE | ORAL | Status: AC
  Administered 2021-12-14: 300 mg via ORAL

## 2021-12-14 MED ORDER — PROPOFOL 10 MG/ML IV BOLUS
INTRAVENOUS | Status: DC | PRN
  Administered 2021-12-14: 140 mg via INTRAVENOUS
  Administered 2021-12-14: 60 mg via INTRAVENOUS

## 2021-12-14 MED ORDER — VANCOMYCIN HCL IN DEXTROSE 1-5 GM/200ML-% IV SOLN
1000.0000 mg | INTRAVENOUS | Status: AC
  Administered 2021-12-14: 1000 mg via INTRAVENOUS

## 2021-12-14 MED ORDER — GABAPENTIN 300 MG PO CAPS
ORAL_CAPSULE | ORAL | Status: AC
  Filled 2021-12-14: qty 1

## 2021-12-14 MED ORDER — ACETAMINOPHEN 500 MG PO TABS
ORAL_TABLET | ORAL | Status: AC
  Filled 2021-12-14: qty 2

## 2021-12-14 MED ORDER — FENTANYL CITRATE (PF) 100 MCG/2ML IJ SOLN
INTRAMUSCULAR | Status: DC | PRN
  Administered 2021-12-14: 25 ug via INTRAVENOUS

## 2021-12-14 MED ORDER — EPHEDRINE SULFATE (PRESSORS) 50 MG/ML IJ SOLN
INTRAMUSCULAR | Status: DC | PRN
  Administered 2021-12-14 (×2): 10 mg via INTRAVENOUS
  Administered 2021-12-14 (×2): 5 mg via INTRAVENOUS

## 2021-12-14 MED ORDER — CELECOXIB 200 MG PO CAPS
ORAL_CAPSULE | ORAL | Status: AC
  Filled 2021-12-14: qty 1

## 2021-12-14 MED ORDER — BUPIVACAINE-EPINEPHRINE 0.25% -1:200000 IJ SOLN
INTRAMUSCULAR | Status: DC | PRN
  Administered 2021-12-14: 7 mL

## 2021-12-14 MED ORDER — ONDANSETRON HCL 4 MG/2ML IJ SOLN
INTRAMUSCULAR | Status: AC
  Filled 2021-12-14: qty 2

## 2021-12-14 MED ORDER — LIDOCAINE 2% (20 MG/ML) 5 ML SYRINGE
INTRAMUSCULAR | Status: AC
  Filled 2021-12-14: qty 5

## 2021-12-14 MED ORDER — ONDANSETRON HCL 4 MG/2ML IJ SOLN: 4.0000 mg | Freq: Once | INTRAMUSCULAR | Status: DC | PRN

## 2021-12-14 SURGICAL SUPPLY — 43 items
ADH SKN CLS APL DERMABOND .7 (GAUZE/BANDAGES/DRESSINGS) ×1
APL PRP STRL LF DISP 70% ISPRP (MISCELLANEOUS) ×1
APPLIER CLIP 9.375 MED OPEN (MISCELLANEOUS) ×1
APR CLP MED 9.3 20 MLT OPN (MISCELLANEOUS) ×1
BLADE SURG 15 STRL LF DISP TIS (BLADE) ×1 IMPLANT
BLADE SURG 15 STRL SS (BLADE) ×1
CANISTER SUC SOCK COL 7IN (MISCELLANEOUS) IMPLANT
CANISTER SUCT 1200ML W/VALVE (MISCELLANEOUS) IMPLANT
CHLORAPREP W/TINT 26 (MISCELLANEOUS) ×1 IMPLANT
CLIP APPLIE 9.375 MED OPEN (MISCELLANEOUS) ×1 IMPLANT
COVER BACK TABLE 60X90IN (DRAPES) ×1 IMPLANT
COVER MAYO STAND STRL (DRAPES) ×1 IMPLANT
COVER PROBE W GEL 5X96 (DRAPES) ×1 IMPLANT
DERMABOND ADVANCED .7 DNX12 (GAUZE/BANDAGES/DRESSINGS) ×1 IMPLANT
DRAPE LAPAROSCOPIC ABDOMINAL (DRAPES) ×1 IMPLANT
DRAPE UTILITY XL STRL (DRAPES) ×1 IMPLANT
ELECT COATED BLADE 2.86 ST (ELECTRODE) ×1 IMPLANT
ELECT REM PT RETURN 9FT ADLT (ELECTROSURGICAL) ×1
ELECTRODE REM PT RTRN 9FT ADLT (ELECTROSURGICAL) ×1 IMPLANT
GLOVE BIO SURGEON STRL SZ7.5 (GLOVE) ×1 IMPLANT
GOWN STRL REUS W/ TWL LRG LVL3 (GOWN DISPOSABLE) ×2 IMPLANT
GOWN STRL REUS W/TWL LRG LVL3 (GOWN DISPOSABLE) ×2
ILLUMINATOR WAVEGUIDE N/F (MISCELLANEOUS) IMPLANT
KIT MARKER MARGIN INK (KITS) ×1 IMPLANT
LIGHT WAVEGUIDE WIDE FLAT (MISCELLANEOUS) IMPLANT
NDL HYPO 25X1 1.5 SAFETY (NEEDLE) ×1 IMPLANT
NDL SAFETY ECLIP 18X1.5 (MISCELLANEOUS) IMPLANT
NEEDLE HYPO 25X1 1.5 SAFETY (NEEDLE) ×1 IMPLANT
NS IRRIG 1000ML POUR BTL (IV SOLUTION) IMPLANT
PACK BASIN DAY SURGERY FS (CUSTOM PROCEDURE TRAY) ×1 IMPLANT
PENCIL SMOKE EVACUATOR (MISCELLANEOUS) ×1 IMPLANT
SLEEVE SCD COMPRESS KNEE MED (STOCKING) ×1 IMPLANT
SPIKE FLUID TRANSFER (MISCELLANEOUS) IMPLANT
SPONGE T-LAP 18X18 ~~LOC~~+RFID (SPONGE) ×1 IMPLANT
SUT MON AB 4-0 PC3 18 (SUTURE) ×2 IMPLANT
SUT SILK 2 0 SH (SUTURE) IMPLANT
SUT VICRYL 3-0 CR8 SH (SUTURE) ×1 IMPLANT
SYR CONTROL 10ML LL (SYRINGE) ×1 IMPLANT
TOWEL GREEN STERILE FF (TOWEL DISPOSABLE) ×1 IMPLANT
TRACER MAGTRACE VIAL (MISCELLANEOUS) IMPLANT
TRAY FAXITRON CT DISP (TRAY / TRAY PROCEDURE) ×1 IMPLANT
TUBE CONNECTING 20X1/4 (TUBING) IMPLANT
YANKAUER SUCT BULB TIP NO VENT (SUCTIONS) IMPLANT

## 2021-12-14 NOTE — Progress Notes (Signed)
Assisted Dr. Turk with left, pectoralis, ultrasound guided block. Side rails up, monitors on throughout procedure. See vital signs in flow sheet. Tolerated Procedure well. 

## 2021-12-14 NOTE — Discharge Instructions (Signed)

## 2021-12-14 NOTE — Anesthesia Preprocedure Evaluation (Addendum)
Anesthesia Evaluation  Patient identified by MRN, date of birth, ID band Patient awake    Reviewed: Allergy & Precautions, NPO status , Patient's Chart, lab work & pertinent test results, reviewed documented beta blocker date and time   Airway Mallampati: II  TM Distance: >3 FB Neck ROM: Full    Dental  (+) Dental Advisory Given, Partial Upper   Pulmonary asthma ,    Pulmonary exam normal breath sounds clear to auscultation       Cardiovascular hypertension, Pt. on medications and Pt. on home beta blockers Normal cardiovascular exam Rhythm:Regular Rate:Normal     Neuro/Psych negative neurological ROS     GI/Hepatic Neg liver ROS, GERD  Medicated,  Endo/Other  Hypothyroidism Obesity   Renal/GU negative Renal ROS     Musculoskeletal negative musculoskeletal ROS (+)   Abdominal   Peds  Hematology negative hematology ROS (+)   Anesthesia Other Findings Day of surgery medications reviewed with the patient.  LEFT BREAST CANCER  Reproductive/Obstetrics                            Anesthesia Physical Anesthesia Plan  ASA: 2  Anesthesia Plan: General   Post-op Pain Management: Tylenol PO (pre-op)* and Regional block*   Induction: Intravenous  PONV Risk Score and Plan: 3 and Scopolamine patch - Pre-op, Midazolam, Dexamethasone and Ondansetron  Airway Management Planned: LMA  Additional Equipment:   Intra-op Plan:   Post-operative Plan: Extubation in OR  Informed Consent: I have reviewed the patients History and Physical, chart, labs and discussed the procedure including the risks, benefits and alternatives for the proposed anesthesia with the patient or authorized representative who has indicated his/her understanding and acceptance.     Dental advisory given  Plan Discussed with: CRNA  Anesthesia Plan Comments:       Anesthesia Quick Evaluation

## 2021-12-14 NOTE — Anesthesia Postprocedure Evaluation (Signed)
Anesthesia Post Note  Patient: Sara House  Procedure(s) Performed: LEFT BREAST LUMPECTOMY WITH RADIOACTIVE SEED AND SENTINEL LYMPH NODE BIOPSY (Left: Breast)     Patient location during evaluation: PACU Anesthesia Type: General Level of consciousness: awake and alert Pain management: pain level controlled Vital Signs Assessment: post-procedure vital signs reviewed and stable Respiratory status: spontaneous breathing, nonlabored ventilation, respiratory function stable and patient connected to nasal cannula oxygen Cardiovascular status: blood pressure returned to baseline and stable Postop Assessment: no apparent nausea or vomiting Anesthetic complications: no   No notable events documented.  Last Vitals:  Vitals:   12/14/21 1254 12/14/21 1330  BP:    Pulse: (!) 53 (!) 59  Resp: 14   Temp:    SpO2: 96% 98%    Last Pain:  Vitals:   12/14/21 1330  TempSrc:   PainSc: [name]

## 2021-12-14 NOTE — Anesthesia Procedure Notes (Signed)
Procedure Name: LMA Insertion Date/Time: 12/14/2021 10:43 AM  Performed by: Lavonia Dana, CRNAPre-anesthesia Checklist: Patient identified, Emergency Drugs available, Suction available and Patient being monitored Patient Re-evaluated:Patient Re-evaluated prior to induction Oxygen Delivery Method: Circle system utilized Preoxygenation: Pre-oxygenation with 100% oxygen Induction Type: IV induction Ventilation: Mask ventilation without difficulty LMA: LMA inserted LMA Size: 4.0 Number of attempts: 1 Airway Equipment and Method: Bite block Placement Confirmation: positive ETCO2 Tube secured with: Tape Dental Injury: Teeth and Oropharynx as per pre-operative assessment

## 2021-12-14 NOTE — Anesthesia Procedure Notes (Signed)
Anesthesia Regional Block: Pectoralis block   Pre-Anesthetic Checklist: , timeout performed,  Correct Patient, Correct Site, Correct Laterality,  Correct Procedure, Correct Position, site marked,  Risks and benefits discussed,  Surgical consent,  Pre-op evaluation,  At surgeon's request and post-op pain management  Laterality: Left  Prep: chloraprep       Needles:  Injection technique: Single-shot  Needle Type: Echogenic Needle     Needle Length: 9cm  Needle Gauge: 21     Additional Needles:   Procedures:,,,, ultrasound used (permanent image in chart),,    Narrative:  Start time: 12/14/2021 9:20 AM End time: 12/14/2021 9:27 AM Injection made incrementally with aspirations every 5 mL.  Performed by: Personally  Anesthesiologist: Santa Lighter, MD  Additional Notes: No pain on injection. No increased resistance to injection. Injection made in 5cc increments.  Good needle visualization.  Patient tolerated procedure well.

## 2021-12-14 NOTE — Transfer of Care (Signed)
Immediate Anesthesia Transfer of Care Note  Patient: Sara House  Procedure(s) Performed: LEFT BREAST LUMPECTOMY WITH RADIOACTIVE SEED AND SENTINEL LYMPH NODE BIOPSY (Left: Breast)  Patient Location: PACU  Anesthesia Type:GA combined with regional for post-op pain  Level of Consciousness: drowsy  Airway & Oxygen Therapy: Patient Spontanous Breathing and Patient connected to face mask oxygen  Post-op Assessment: Report given to RN and Post -op Vital signs reviewed and stable  Post vital signs: Reviewed and stable  Last Vitals:  Vitals Value Taken Time  BP 149/78 12/14/21 1153  Temp    Pulse 52 12/14/21 1154  Resp 11 12/14/21 1154  SpO2 100 % 12/14/21 1154  Vitals shown include unvalidated device data.  Last Pain:  Vitals:   12/14/21 0901  TempSrc: Oral  PainSc: 0-No pain         Complications: No notable events documented.

## 2021-12-14 NOTE — Op Note (Signed)
12/14/2021  11:44 AM  PATIENT:  Sara House  66 y.o. female  PRE-OPERATIVE DIAGNOSIS:  LEFT BREAST CANCER  POST-OPERATIVE DIAGNOSIS:  LEFT BREAST CANCER  PROCEDURE:  Procedure(s): LEFT BREAST LUMPECTOMY WITH RADIOACTIVE SEED LOCALIZATION AND DEEP LEFT AXILLARY SENTINEL LYMPH NODE BIOPSY (Left)  SURGEON:  Surgeon(s) and Role:    * Jovita Kussmaul, MD - Primary  PHYSICIAN ASSISTANT:   ASSISTANTS: none   ANESTHESIA:   local and general  EBL:  15 mL   BLOOD ADMINISTERED:none  DRAINS: none   LOCAL MEDICATIONS USED:  MARCAINE     SPECIMEN:  Source of Specimen:  left breast tissue with additional medial margin and sentinel node biopsy x 2  DISPOSITION OF SPECIMEN:  PATHOLOGY  COUNTS:  YES  TOURNIQUET:  * No tourniquets in log *  DICTATION: .Dragon Dictation  After informed consent was obtained the patient was brought to the operating room and placed in the supine position on the operating table.  After adequate induction of general anesthesia the patient's left breast, chest, and axillary area were prepped with ChloraPrep, allowed to dry, and draped in usual sterile manner.  An appropriate timeout was performed.  Previously an I-125 seed was placed in the upper outer quadrant of the left breast to mark an area of invasive breast cancer.  At this point, 2 cc of iron oxide were injected into the subareolar plexus of the left breast and the breast was massaged for several minutes.  Attention was first turned to the breast itself.  The neoprobe was set to I-125 in the area of radioactivity was readily identified.  The area was fairly far into the upper outer quadrant of the left breast.  I elected to make an elliptical incision in the skin overlying the area of radioactivity with a 15 blade knife.  The incision was then carried through the skin and subcutaneous tissue sharply with the electrocautery.  Dissection was then carried around the radioactive seed while checking the area  of radioactivity frequently.  This dissection was carried all the way to the chest wall.  Once the specimen was removed it was oriented with the appropriate paint colors.  A specimen radiograph was obtained that showed the clip and seed to be near the center of the specimen.  The specimen was then sent to pathology for further evaluation.  I elected to take an additional medial margin which was also marked with the appropriate paint color and sent to pathology.  The Sentimag was then used to identify a signal in the left axilla.  I was able to access the deep left axillary space through the lumpectomy cavity.  I was able to identify 2 lymph nodes with signal.  These were excised sharply with the electrocautery and the surrounding small vessels and lymphatics were controlled with clips.  These were sent as sentinel nodes numbers 1 and 2.  No other hot or palpable nodes were identified in the left axilla.  Hemostasis was achieved using the Bovie electrocautery.  The axilla was then closed with interrupted 3-0 Vicryl stitches.  The lumpectomy cavity was irrigated with saline and infiltrated with quarter percent Marcaine.  The cavity was marked with clips.  The lumpectomy cavity was then closed with interrupted 3-0 Vicryl stitches.  The skin was then closed with a running 4-0 Monocryl subcuticular stitch.  Dermabond dressings were applied.  The patient tolerated the procedure well.  At the end of the case all needle sponge and instrument counts were  correct.  The patient was then awakened and taken to recovery in stable condition.  PLAN OF CARE: Discharge to home after PACU  PATIENT DISPOSITION:  PACU - hemodynamically stable.   Delay start of Pharmacological VTE agent (>24hrs) due to surgical blood loss or risk of bleeding: not applicable

## 2021-12-14 NOTE — Interval H&P Note (Signed)
History and Physical Interval Note:  12/14/2021 10:18 AM  Sara House  has presented today for surgery, with the diagnosis of LEFT BREAST CANCER.  The various methods of treatment have been discussed with the patient and family. After consideration of risks, benefits and other options for treatment, the patient has consented to  Procedure(s): LEFT BREAST LUMPECTOMY WITH RADIOACTIVE SEED AND SENTINEL LYMPH NODE BIOPSY (Left) as a surgical intervention.  The patient's history has been reviewed, patient examined, no change in status, stable for surgery.  I have reviewed the patient's chart and labs.  Questions were answered to the patient's satisfaction.     [name]

## 2021-12-14 NOTE — H&P (Signed)
REFERRING PHYSICIAN: Heywood House, *  PROVIDER: Landry Corporal, MD  MRN: [id number] DOB: 1955-09-26 Subjective   Chief Complaint: Breast Cancer   History of Present Illness: Sara House is a 66 y.o. female who is seen today as an office consultation for evaluation of Breast Cancer .   We are asked to see the patient in consultation by Dr. Clyde Lundborg to evaluate her for a new left breast cancer. The patient is a 66 year old white female who recently went for a routine screening mammogram. At that time she was found to have a 6 mm mass in the upper outer quadrant of the left breast. The axilla looked normal. The mass was biopsied and came back as an invasive ductal type of breast cancer. The tumor markers have not been reported yet. She does have a family history of breast cancer in her sister. She does not smoke.  Review of Systems: A complete review of systems was obtained from the patient. I have reviewed this information and discussed as appropriate with the patient. See HPI as well for other ROS.  ROS   Medical History: Past Medical History:  Diagnosis Date  Asthma, unspecified asthma severity, unspecified whether complicated, unspecified whether persistent  GERD (gastroesophageal reflux disease)  Hypertension  Thyroid disease   There is no problem list on file for this patient.  Past Surgical History:  Procedure Laterality Date  CHOLECYSTECTOMY  HYSTERECTOMY    Allergies  Allergen Reactions  Penicillins Other (See Comments)   Current Outpatient Medications on File Prior to Visit  Medication Sig Dispense Refill  aspirin 81 MG EC tablet Take by mouth once daily  BREZTRI AEROSPHERE 160-9-4.8 mcg/actuation inhaler INHALE 2 PUFFS IN THE MORNING AND AT BEDTIME  cetirizine (ZYRTEC) 10 MG tablet Take 1 tablet by mouth once daily  cholecalciferol (VITAMIN D3) 1000 unit tablet Take by mouth once daily  cyanocobalamin (VITAMIN B12) 1000 MCG tablet Take by  mouth  docusate (COLACE) 100 MG capsule Take by mouth once daily  L-LYSINE ORAL Take by mouth  levothyroxine (SYNTHROID) 50 MCG tablet Take 50 mcg by mouth once daily  losartan (COZAAR) 50 MG tablet Take 50 mg by mouth once daily  lovastatin (MEVACOR) 40 MG tablet Take 40 mg by mouth at bedtime  montelukast (SINGULAIR) 10 mg tablet Take 1 tablet by mouth once daily  omega-3 fatty acids/fish oil (FISH OIL) 340-1,000 mg capsule Take by mouth  propranoloL (INDERAL) 80 MG tablet Take by mouth   No current facility-administered medications on file prior to visit.   Family History  Problem Relation Age of Onset  Skin cancer Mother  High blood pressure (Hypertension) Mother  Myocardial Infarction (Heart attack) Mother  Stroke Father  High blood pressure (Hypertension) Father  Myocardial Infarction (Heart attack) Father  Breast cancer Sister  High blood pressure (Hypertension) Sister  Hyperlipidemia (Elevated cholesterol) Sister    Social History   Tobacco Use  Smoking Status Never  Smokeless Tobacco Never    Social History   Socioeconomic History  Marital status: Married  Tobacco Use  Smoking status: Never  Smokeless tobacco: Never  Substance and Sexual Activity  Alcohol use: Never  Drug use: Never   Objective:   Vitals:  BP: 126/84  Pulse: 68  Weight: 80.2 kg (176 lb 12.8 oz)  Height: 161.3 cm (5' 3.5")   Body mass index is 30.83 kg/m.  Physical Exam Vitals reviewed.  Constitutional:  General: She is not in acute distress. Appearance: Normal appearance.  HENT:  Head: Normocephalic and atraumatic.  Right Ear: External ear normal.  Left Ear: External ear normal.  Nose: Nose normal.  Mouth/Throat:  Mouth: Mucous membranes are moist.  Pharynx: Oropharynx is clear.  Eyes:  General: No scleral icterus. Extraocular Movements: Extraocular movements intact.  Conjunctiva/sclera: Conjunctivae normal.  Pupils: Pupils are equal, round, and reactive to light.   Cardiovascular:  Rate and Rhythm: Normal rate and regular rhythm.  Pulses: Normal pulses.  Heart sounds: Normal heart sounds.  Pulmonary:  Effort: Pulmonary effort is normal. No respiratory distress.  Breath sounds: Normal breath sounds.  Abdominal:  General: Bowel sounds are normal.  Palpations: Abdomen is soft.  Tenderness: There is no abdominal tenderness.  Musculoskeletal:  General: No swelling, tenderness or deformity. Normal range of motion.  Cervical back: Normal range of motion and neck supple.  Skin: General: Skin is warm and dry.  Coloration: Skin is not jaundiced.  Neurological:  General: No focal deficit present.  Mental Status: She is alert and oriented to person, place, and time.  Psychiatric:  Mood and Affect: Mood normal.  Behavior: Behavior normal.    Breast: There is no palpable mass in either breast. There is no palpable axillary, supraclavicular, or cervical lymphadenopathy.  Labs, Imaging and Diagnostic Testing:  Assessment and Plan:   Diagnoses and all orders for this visit:  Malignant neoplasm of upper-outer quadrant of left female breast, unspecified estrogen receptor status (CMS-HCC)    The patient has a 6 mm cancer in the upper outer quadrant of the left breast with clinically negative nodes. The tumor markers have not been reported yet. I have discussed with her in detail the different options for treatment and at this point she favors breast conservation which I feel is very reasonable. She is also a good candidate for sentinel node biopsy. I have discussed with her in detail the risks and benefits of the operation as well as some of the technical aspects including use of a radioactive seed for localization and she understands and wishes to proceed. I will go ahead and refer her to medical and radiation oncology to discuss adjuvant therapy. We will also send her to physical therapy for preoperative lymphedema testing. We will call her once we have  the results of her tumor markers but we can go ahead with surgical planning.

## 2021-12-14 NOTE — H&P (View-Only) (Signed)
Assisted Dr. Turk with left, pectoralis, ultrasound guided block. Side rails up, monitors on throughout procedure. See vital signs in flow sheet. Tolerated Procedure well. 

## 2021-12-15 ENCOUNTER — Encounter (HOSPITAL_BASED_OUTPATIENT_CLINIC_OR_DEPARTMENT_OTHER): Payer: Self-pay | Admitting: General Surgery

## 2021-12-16 LAB — SURGICAL PATHOLOGY

## 2021-12-21 ENCOUNTER — Encounter: Payer: Self-pay | Admitting: *Deleted

## 2021-12-21 ENCOUNTER — Telehealth: Payer: Self-pay | Admitting: *Deleted

## 2021-12-21 NOTE — Telephone Encounter (Signed)
Ordered oncotype per Dr. Iruku. Requisition sent to pathology and exact sciences 

## 2021-12-27 ENCOUNTER — Encounter (HOSPITAL_COMMUNITY): Payer: Self-pay

## 2022-01-04 ENCOUNTER — Ambulatory Visit: Payer: Medicare Other | Attending: General Surgery | Admitting: Rehabilitation

## 2022-01-04 ENCOUNTER — Encounter: Payer: Self-pay | Admitting: Rehabilitation

## 2022-01-04 DIAGNOSIS — C50412 Malignant neoplasm of upper-outer quadrant of left female breast: Secondary | ICD-10-CM | POA: Insufficient documentation

## 2022-01-04 DIAGNOSIS — R293 Abnormal posture: Secondary | ICD-10-CM | POA: Diagnosis present

## 2022-01-04 DIAGNOSIS — Z483 Aftercare following surgery for neoplasm: Secondary | ICD-10-CM | POA: Diagnosis present

## 2022-01-04 NOTE — Therapy (Signed)
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/22/2021   right breast IDC   GERD (gastroesophageal reflux disease)    History of COVID-19    HLD (hyperlipidemia)    HTN (hypertension)    Hypothyroidism    Pneumonia 2022   covid pneumonia   Sinus congestion    Past Surgical History:  Procedure Laterality Date   ABDOMINAL HYSTERECTOMY     BREAST LUMPECTOMY WITH RADIOACTIVE SEED AND SENTINEL LYMPH NODE BIOPSY Left 12/14/2021   Procedure: LEFT BREAST LUMPECTOMY WITH RADIOACTIVE SEED AND SENTINEL LYMPH NODE BIOPSY;  Surgeon: Jovita Kussmaul, MD;  Location: [place];  Service: General;  Laterality: Left;   CHOLECYSTECTOMY     DUPUYTREN CONTRACTURE RELEASE     GALLBLADDER SURGERY     LAPAROSCOPIC HYSTERECTOMY     left rotator cuff repair Left    Patient Active Problem List   Diagnosis Date Noted   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 Autumn Messing  REFERRING DIAG: Malignant neoplasm of upper-outer quadrant of left female breast, unspecified estrogen receptor status (CMS-HCC  THERAPY DIAG:  Primary malignant neoplasm of upper outer quadrant of left  female breast ([place])  Abnormal posture  Aftercare following surgery for neoplasm  Rationale for Evaluation and Treatment Rehabilitation  ONSET DATE: 11/18/21  SUBJECTIVE:                                                                                                                                                                                           SUBJECTIVE STATEMENT: I am still hurting.  I can't stand anything touching me on there.    PERTINENT HISTORY:  Had left lumpectomy and SLNB on 12/14/21 due to [facility] of the left breast. 2 lymph nodes removed. Tumor markers unknown at this time.  Hx of left surgery RCR.   PATIENT GOALS:  Reassess how my recovery is going related to arm function, pain, and swelling.  PAIN:  Are you having pain? Yes: NPRS scale: at night its a 7/10, right now it is about a 4/10 Pain location: at the incision Pain description: burning, aching Aggravating factors: none Relieving factors: pain medication  PRECAUTIONS: Recent Surgery, left UE Lymphedema risk  ACTIVITY LEVEL / LEISURE: Still not doing housework, washing clothes okay   OBJECTIVE:   PATIENT SURVEYS:  QUICK DASH: 59% from 18%  OBSERVATIONS:  Well healed incision  POSTURE:  Rounded shoulders   LYMPHEDEMA ASSESSMENT:  UPPER EXTREMITY AROM/PROM:   A/PROM RIGHT   eval    Shoulder extension    Shoulder flexion 163  Shoulder abduction 155  Shoulder internal rotation    Shoulder external rotation                            (Blank rows = not tested)   A/PROM LEFT   eval 01/04/22  Shoulder extension 60 65  Shoulder flexion 160 155 pull  Shoulder abduction 155 153  Shoulder internal rotation     Shoulder external rotation 90 95                          (Blank rows = not tested)     LYMPHEDEMA ASSESSMENTS:    LANDMARK RIGHT   eval  10 cm proximal to olecranon process 32.5  Olecranon process 28  10 cm proximal to ulnar styloid process 23.3  Just proximal to ulnar styloid  process 17.7  Across hand at thumb web space 20.4  At base of 2nd digit 6.8  (Blank rows = not tested)   LANDMARK LEFT   eval 01/04/22  10 cm proximal to olecranon process 32.8 32.8  Olecranon process _0 cm proximal to ulnar styloid process 23.1 23.2  Just proximal to ulnar styloid process 17.5 17.6  Across hand at thumb web space 20.3 20.5  At base of 2nd digit 7.3 7.5  (Blank rows = not tested)    PATIENT EDUCATION:  Education details: post op care per below, ABC class information as pt can't do the class Person educated: Patient Education method: Explanation and Handouts Education comprehension: verbalized understanding  HOME EXERCISE PROGRAM:  Reviewed previously given post op HEP.   ASSESSMENT: CLINICAL IMPRESSION: Pt is doing very well now post-op. She is having the most trouble with nerve related pain and hypersensitivity but has returned to almost baseline AROM.  Pt lives an hour away and feels good with her HEP.    Pt will benefit from skilled therapeutic intervention to improve on the following deficits: Decreased knowledge of precautions, impaired UE functional use, pain, decreased ROM, postural dysfunction.   PT treatment/interventions: ADL/Self care home management, Patient/Family education     GOALS: Goals reviewed with patient? Yes  LONG TERM GOALS:  (STG=LTG)  GOALS Name Target Date  Goal status  1 Pt will demonstrate she has regained full shoulder ROM and function post operatively compared to baselines.  Baseline: 01/04/22 MET                    PLAN: PT FREQUENCY/DURATION: SOZO screens only   PLAN FOR  NEXT SESSION: [facility]  [address], [address]  [place] [address]  [phone]  After Breast Cancer Class It is recommended you attend the ABC class to be educated on lymphedema risk reduction. This class is free of charge and lasts for 1 hour. It is a 1-time class. You will need to download the  Webex app either on your phone or computer. We will send you a link the night before or the morning of the class. You should be able to click on that link to join the class. This is not a confidential class. You don't have to turn your camera on, but other participants may be able to see your email address.  Scar massage You can begin gentle scar massage to you incision sites. Gently place one hand on the incision and move the skin (without sliding on the skin) in various directions. Do this for a few minutes and then you can gently massage either coconut oil or vitamin E cream into the scars.  Compression garment You should continue wearing your compression bra until you feel like you no longer have swelling.  Home exercise Program Continue doing the exercises you were given until you feel like you can do them without feeling any tightness at the end.   Walking Program Studies show that 30 minutes of walking per day (fast enough to elevate your heart rate) can significantly reduce the risk of a cancer recurrence. If you can't walk due to other medical reasons, we encourage you to find another activity you could do (like a stationary bike or water exercise).  Posture After breast cancer surgery, people frequently sit with rounded shoulders posture because it puts their incisions on slack and feels better. If you sit like this and scar tissue forms in that position, you can become very tight and have pain sitting or standing with good posture. Try to be aware of your posture and sit and stand up tall to heal properly.  Follow up PT: It is recommended you return every 3 months for the first 3 years following surgery to be assessed on the SOZO machine for an L-Dex score. This helps prevent clinically significant lymphedema in 95% of patients. These follow up screens are 10 minute appointments that you are not billed for.  Stark Bray, PT 01/04/2022, 2:08 PM

## 2022-01-04 NOTE — Patient Instructions (Signed)
     [facility]  [address], [address]  [place] [address]  [phone]  After Breast Cancer Class It is recommended you attend the ABC class to be educated on lymphedema risk reduction. This class is free of charge and lasts for 1 hour. It is a 1-time class. You will need to download the Webex app either on your phone or computer. We will send you a link the night before or the morning of the class. You should be able to click on that link to join the class. This is not a confidential class. You don't have to turn your camera on, but other participants may be able to see your email address.  Scar massage You can begin gentle scar massage to you incision sites. Gently place one hand on the incision and move the skin (without sliding on the skin) in various directions. Do this for a few minutes and then you can gently massage either coconut oil or vitamin E cream into the scars.  Compression garment You should continue wearing your compression bra until you feel like you no longer have swelling.  Home exercise Program Continue doing the exercises you were given until you feel like you can do them without feeling any tightness at the end.   Walking Program Studies show that 30 minutes of walking per day (fast enough to elevate your heart rate) can significantly reduce the risk of a cancer recurrence. If you can't walk due to other medical reasons, we encourage you to find another activity you could do (like a stationary bike or water exercise).  Posture After breast cancer surgery, people frequently sit with rounded shoulders posture because it puts their incisions on slack and feels better. If you sit like this and scar tissue forms in that position, you can become very tight and have pain sitting or standing with good posture. Try to be aware of your posture and sit and stand up tall to heal properly.  Follow up PT: It is recommended you return every 3 months for  the first 2 years following surgery to be assessed on the SOZO machine for an L-Dex score. This helps prevent clinically significant lymphedema in 95% of patients. These follow up screens are 10 minute appointments that you are not billed for.  

## 2022-01-05 ENCOUNTER — Encounter (HOSPITAL_COMMUNITY): Payer: Self-pay

## 2022-01-05 ENCOUNTER — Inpatient Hospital Stay: Payer: Medicare Other | Attending: Hematology and Oncology | Admitting: Hematology and Oncology

## 2022-01-05 ENCOUNTER — Other Ambulatory Visit: Payer: Self-pay

## 2022-01-05 ENCOUNTER — Encounter: Payer: Self-pay | Admitting: Hematology and Oncology

## 2022-01-05 DIAGNOSIS — Z8249 Family history of ischemic heart disease and other diseases of the circulatory system: Secondary | ICD-10-CM | POA: Insufficient documentation

## 2022-01-05 DIAGNOSIS — Z79899 Other long term (current) drug therapy: Secondary | ICD-10-CM | POA: Diagnosis not present

## 2022-01-05 DIAGNOSIS — Z823 Family history of stroke: Secondary | ICD-10-CM | POA: Insufficient documentation

## 2022-01-05 DIAGNOSIS — Z880 Allergy status to penicillin: Secondary | ICD-10-CM | POA: Insufficient documentation

## 2022-01-05 DIAGNOSIS — Z9049 Acquired absence of other specified parts of digestive tract: Secondary | ICD-10-CM | POA: Insufficient documentation

## 2022-01-05 DIAGNOSIS — K219 Gastro-esophageal reflux disease without esophagitis: Secondary | ICD-10-CM | POA: Diagnosis not present

## 2022-01-05 DIAGNOSIS — Z881 Allergy status to other antibiotic agents status: Secondary | ICD-10-CM | POA: Diagnosis not present

## 2022-01-05 DIAGNOSIS — Z803 Family history of malignant neoplasm of breast: Secondary | ICD-10-CM | POA: Insufficient documentation

## 2022-01-05 DIAGNOSIS — C50412 Malignant neoplasm of upper-outer quadrant of left female breast: Secondary | ICD-10-CM | POA: Insufficient documentation

## 2022-01-05 DIAGNOSIS — N6082 Other benign mammary dysplasias of left breast: Secondary | ICD-10-CM | POA: Diagnosis not present

## 2022-01-05 DIAGNOSIS — N6012 Diffuse cystic mastopathy of left breast: Secondary | ICD-10-CM | POA: Insufficient documentation

## 2022-01-05 DIAGNOSIS — Z8049 Family history of malignant neoplasm of other genital organs: Secondary | ICD-10-CM | POA: Diagnosis not present

## 2022-01-05 DIAGNOSIS — Z170 Estrogen receptor positive status [ER+]: Secondary | ICD-10-CM | POA: Insufficient documentation

## 2022-01-05 NOTE — Assessment & Plan Note (Signed)
This is a very pleasant 66 year old postmenopausal female patient with left breast invasive ductal carcinoma, grade 1, ER/PR strongly positive, HER2 0, Ki-67 of 5% referred to breast clinic for further recommendations. She is here after surgery, had left breast lumpectomy and final pathology confirmed grade one 1.2 cm invasive ductal carcinoma, negative margins, negative lymph nodes.  Prior prognostics indicated ER 100% strong PR 85% strong HER2 0 and Ki-67 of 5%.  Oncotype of 10, distant risk recurrence at 9 years of 3%.  We have discussed about proceeding with adjuvant radiation followed by antiestrogen therapy.  We have once again discussed today about tamoxifen versus aromatase inhibitors.  She is not interested in proceeding with tamoxifen, she is very worried about blood clots.  Hence we have focused our discussion on aromatase inhibitors.  We have discussed about adverse effects from aromatase inhibitors including but not limited to postmenopausal symptoms such as hot flashes, vaginal dryness, arthralgias, bone loss etc.  She will be getting a baseline bone density scan in January.  Anastrozole information printed and handed over to the patient for further review.  She will return to clinic in mid December to initiate antiestrogen therapy. We have discussed about continuing wearing compression bras if tolerated for a few more weeks.  I think gabapentin is reasonable for neuropathic pain but discussed that she may want to try for several days before she notices a significant impact.

## 2022-01-05 NOTE — Progress Notes (Signed)
[facility] NOTE  Patient Care Team: Merwyn Katos as PCP - General (Physician Assistant) Mauro Kaufmann, RN as Oncology Nurse Navigator Rockwell Germany, RN as Oncology Nurse Navigator Benay Pike, MD as Consulting Physician (Hematology and Oncology)  CHIEF COMPLAINTS/PURPOSE OF CONSULTATION:  Newly diagnosed breast cancer  HISTORY OF PRESENTING ILLNESS:  Sara House 66 y.o. female is here because of recent diagnosis of left breast cancer  I reviewed her records extensively and collaborated the history with the patient.  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  No suspicious findings in the right breast.  Diagnostic mammogram of the left confirmed irregular mixed echogenicity mass with dense posterior caustic shadowing in the 2:00 location of the left breast 6 cm from the nipple measuring about 0.3 x 0.6 x 0.4 cm.  Left axilla is negative for adenopathy   11/22/2021 Pathology Results   Left breast needle core biopsy showed invasive ductal carcinoma, grade 1 along with focal DCIS, no evidence of lymphovascular invasion.  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 ([place])   11/28/2021 Cancer Staging   Staging form: Breast, AJCC 8th Edition - Clinical stage from 11/28/2021: Stage IA (cT1b, cN0, cM0, G1, ER+, PR+, HER2-) - Signed by Hayden Pedro, PA-C on 11/29/2021 Stage prefix: Initial diagnosis Method of lymph node assessment: Clinical Histologic grading system: 3 grade system   12/14/2021 Definitive Surgery   She is now status post left breast lumpectomy  which showed invasive ductal carcinoma, grade 1, 1.2 cm in greatest dimension, DCIS, solid and cribriform type, grade 2, calcifications associated with invasive carcinoma, negative for lymphovascular invasion.  1 lymph node negative for malignancy.  Left breast margin excision showed benign breast tissue with incidental small intraductal papilloma, usual ductal hyperplasia, fibrocystic change and apocrine metaplasia.  Negative for malignancy.  Left axillary lymph nodes negative for malignancy.  Previous prognostics showed ER 100% strong PR 85% strong HER2 -0, Ki-67 of 5%   12/14/2021 Oncotype testing   Oncotype DX of 10, distant recurrence risk at 9 years of 3% and group average absolute chemotherapy benefit in this age group less than 1%.     Patient arrived to the appointment today with her husband.  Since last visit she underwent surgery, still complains of burning pain in the left breast.  She is just met with the surgery team yesterday and they are very satisfied with the healing.  She was also started on gabapentin for possibly neuropathy pain.  Besides the burning discomfort, she denies any new complaints.  She is going to be simulated for radiation on October 25.  Radiation is going to be lasting for 4 weeks.  She is working with physical therapy. Rest of the pertinent 10 point ROS reviewed and negative  MEDICAL HISTORY:  Past Medical History:  Diagnosis Date   Allergies    Asthma    Breast cancer ([place]) 11/22/2021   right breast IDC   GERD (gastroesophageal reflux disease)    History of COVID-19    HLD (hyperlipidemia)    HTN (hypertension)    Hypothyroidism    Pneumonia 2022  covid pneumonia   Sinus congestion     SURGICAL HISTORY: Past Surgical History:  Procedure Laterality Date   ABDOMINAL HYSTERECTOMY     BREAST LUMPECTOMY WITH RADIOACTIVE SEED AND SENTINEL LYMPH NODE BIOPSY Left 12/14/2021   Procedure: LEFT BREAST LUMPECTOMY WITH RADIOACTIVE SEED AND SENTINEL LYMPH NODE  BIOPSY;  Surgeon: Jovita Kussmaul, MD;  Location: [place];  Service: General;  Laterality: Left;   CHOLECYSTECTOMY     DUPUYTREN CONTRACTURE RELEASE     GALLBLADDER SURGERY     LAPAROSCOPIC HYSTERECTOMY     left rotator cuff repair Left     SOCIAL HISTORY: Social History   Socioeconomic History   Marital status: Married    Spouse name: Not on file   Number of children: Not on file   Years of education: Not on file   Highest education level: Not on file  Occupational History   Not on file  Tobacco Use   Smoking status: Never   Smokeless tobacco: Never  Substance and Sexual Activity   Alcohol use: Never   Drug use: Never   Sexual activity: Yes    Birth control/protection: Surgical    Comment: hyst  Other Topics Concern   Not on file  Social History Narrative   Not on file   Social Determinants of Health   Financial Resource Strain: Not on file  Food Insecurity: Not on file  Transportation Needs: Not on file  Physical Activity: Not on file  Stress: Not on file  Social Connections: Not on file  Intimate Partner Violence: Not on file    FAMILY HISTORY: Family History  Problem Relation Age of Onset   CVA Mother    Stroke Mother    Heart failure Mother    Hypertension Mother    Uterine cancer Mother    CVA Father    Heart disease Father    Breast cancer Sister     ALLERGIES:  is allergic to cefdinir, levofloxacin, penicillins, and doxycycline.  MEDICATIONS:  Current Outpatient Medications  Medication Sig Dispense Refill   aspirin EC 81 MG tablet Take 81 mg by mouth daily. Swallow whole.     Budeson-Glycopyrrol-Formoterol (BREZTRI AEROSPHERE) 160-9-4.8 MCG/ACT AERO Inhale 2 puffs into the lungs in the morning and at bedtime. 10.7 g 11   cetirizine (ZYRTEC) 10 MG tablet Take 10 mg by mouth daily.     Cholecalciferol 25 MCG (1000 UT) tablet Take 1,000 Units by mouth daily.     cyanocobalamin 1000 MCG tablet Take 1,000 mcg by mouth daily.      docusate sodium (COLACE) 100 MG capsule Take 100 mg by mouth 2 (two) times daily.     levothyroxine (SYNTHROID) 50 MCG tablet Take 50 mcg by mouth daily before breakfast.     losartan (COZAAR) 50 MG tablet Take 50 mg by mouth daily.     lovastatin (MEVACOR) 40 MG tablet Take 40 mg by mouth at bedtime.     Lysine 1000 MG TABS Take by mouth.     MELATONIN PO Take 10 mg by mouth.     mometasone (NASONEX) 50 MCG/ACT nasal spray Place 2 sprays into the nose daily. 1 each 11   montelukast (SINGULAIR) 10 MG tablet Take 1 tablet by mouth daily.     Omega-3 Fatty Acids (FISH OIL) 1000 MG CAPS Take 2,000 mg by mouth.     OMEPRAZOLE PO Take 40 mg by mouth.     oxyCODONE (ROXICODONE) 5 MG immediate release tablet Take 1  tablet (5 mg total) by mouth every 6 (six) hours as needed for severe pain. 10 tablet 0   propranolol (INDERAL) 80 MG tablet Take 80 mg by mouth at bedtime.     No current facility-administered medications for this visit.    REVIEW OF SYSTEMS:   Constitutional: Denies fevers, chills or abnormal night sweats Eyes: Denies blurriness of vision, double vision or watery eyes Ears, nose, mouth, throat, and face: Denies mucositis or sore throat Respiratory: Denies cough, dyspnea or wheezes Cardiovascular: Denies palpitation, chest discomfort or lower extremity swelling Gastrointestinal:  Denies nausea, heartburn or change in bowel habits Skin: Denies abnormal skin rashes Lymphatics: Denies new lymphadenopathy or easy bruising Neurological:Denies numbness, tingling or new weaknesses Behavioral/Psych: Mood is stable, no new changes  Breast: Denies any palpable lumps or discharge All other systems were reviewed with the patient and are negative.  PHYSICAL EXAMINATION: ECOG PERFORMANCE STATUS: 0 - Asymptomatic  Vitals:   01/05/22 1154  BP: 137/74  Pulse: (!) 54  Resp: 17  Temp: 97.8 F (36.6 C)  SpO2: 97%    Filed Weights   01/05/22 1154  Weight: 180 lb 5 oz (81.8 kg)    GENERAL:alert, no distress and comfortable Breast: Left breast surgical scar appears well.  No concerns.  LABORATORY DATA:  I have reviewed the data as listed Lab Results  Component Value Date   WBC 6.4 10/21/2008   HGB 14.0 10/21/2008   HCT 42.6 10/21/2008   MCV 88.3 10/21/2008   PLT 201 10/21/2008   Lab Results  Component Value Date   NA 140 10/21/2008   K 4.4 10/21/2008   CL 106 10/21/2008   CO2 28 10/21/2008    RADIOGRAPHIC STUDIES: I have personally reviewed the radiological reports and agreed with the findings in the report.  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 She is here after surgery, had left breast lumpectomy and final pathology confirmed grade one 1.2 cm invasive ductal carcinoma, negative margins, negative lymph nodes.  Prior prognostics indicated ER 100% strong PR 85% strong HER2 0 and Ki-67 of 5%.  Oncotype of 10, distant risk recurrence at 9 years of 3%.  We have discussed about proceeding with adjuvant radiation followed by antiestrogen therapy.  We have once again discussed today about tamoxifen versus aromatase inhibitors.  She is not interested in proceeding with tamoxifen, she is very worried about blood clots.  Hence we have focused our discussion on aromatase inhibitors.  We have discussed about adverse effects from aromatase inhibitors including but not limited to postmenopausal symptoms such as hot flashes, vaginal dryness, arthralgias, bone loss etc.  She will be getting a baseline bone density scan in January.  Anastrozole information printed and handed over to the patient for further review.  She will return to clinic in mid December to initiate antiestrogen therapy. We have  discussed about continuing wearing compression bras if tolerated for a few more weeks.  I think gabapentin is reasonable for neuropathic pain but discussed that she may want to try for several days before she notices a significant impact.   Total time spent: 30 minutes All questions were answered. The patient knows to call the clinic with any problems, questions or concerns.    Benay Pike, MD 01/05/22

## 2022-01-06 ENCOUNTER — Telehealth: Payer: Self-pay | Admitting: *Deleted

## 2022-01-06 ENCOUNTER — Encounter: Payer: Self-pay | Admitting: *Deleted

## 2022-01-06 NOTE — Telephone Encounter (Signed)
Received oncotype score of 10. Physician team notified. Called pt with results, discusses chemo not recommended.

## 2022-01-10 ENCOUNTER — Ambulatory Visit
Admission: RE | Admit: 2022-01-10 | Discharge: 2022-01-10 | Disposition: A | Discharge status: Home / Self Care | Payer: Medicare Other | Source: Ambulatory Visit | Attending: Radiation Oncology | Admitting: Radiation Oncology

## 2022-01-10 DIAGNOSIS — Z510 Encounter for antineoplastic radiation therapy: Secondary | ICD-10-CM | POA: Insufficient documentation

## 2022-01-10 DIAGNOSIS — C50412 Malignant neoplasm of upper-outer quadrant of left female breast: Secondary | ICD-10-CM | POA: Diagnosis present

## 2022-01-12 DIAGNOSIS — Z510 Encounter for antineoplastic radiation therapy: Secondary | ICD-10-CM | POA: Diagnosis not present

## 2022-01-17 ENCOUNTER — Other Ambulatory Visit: Payer: Self-pay

## 2022-01-17 ENCOUNTER — Encounter: Payer: Self-pay | Admitting: *Deleted

## 2022-01-17 ENCOUNTER — Ambulatory Visit
Admission: RE | Admit: 2022-01-17 | Discharge: 2022-01-17 | Disposition: A | Discharge status: Home / Self Care | Payer: Medicare Other | Source: Ambulatory Visit | Attending: Radiation Oncology | Admitting: Radiation Oncology

## 2022-01-17 DIAGNOSIS — Z510 Encounter for antineoplastic radiation therapy: Secondary | ICD-10-CM | POA: Diagnosis not present

## 2022-01-17 DIAGNOSIS — Z923 Personal history of irradiation: Secondary | ICD-10-CM

## 2022-01-17 HISTORY — DX: Personal history of irradiation: Z92.3

## 2022-01-17 LAB — RAD ONC ARIA SESSION SUMMARY
Course Elapsed Days: 0
Plan Fractions Treated to Date: 1
Plan Prescribed Dose Per Fraction: 2.66 Gy
Plan Total Fractions Prescribed: 16
Plan Total Prescribed Dose: 42.56 Gy
Reference Point Dosage Given to Date: 2.66 Gy
Reference Point Session Dosage Given: 2.66 Gy
Session Number: 1

## 2022-01-18 ENCOUNTER — Ambulatory Visit
Admission: RE | Admit: 2022-01-18 | Discharge: 2022-01-18 | Disposition: A | Discharge status: Home / Self Care | Payer: Medicare Other | Source: Ambulatory Visit | Attending: Radiation Oncology | Admitting: Radiation Oncology

## 2022-01-18 ENCOUNTER — Other Ambulatory Visit: Payer: Self-pay

## 2022-01-18 DIAGNOSIS — C50412 Malignant neoplasm of upper-outer quadrant of left female breast: Secondary | ICD-10-CM | POA: Insufficient documentation

## 2022-01-18 LAB — RAD ONC ARIA SESSION SUMMARY
Course Elapsed Days: 1
Plan Fractions Treated to Date: 2
Plan Prescribed Dose Per Fraction: 2.66 Gy
Plan Total Fractions Prescribed: 16
Plan Total Prescribed Dose: 42.56 Gy
Reference Point Dosage Given to Date: 5.32 Gy
Reference Point Session Dosage Given: 2.66 Gy
Session Number: 2

## 2022-01-19 ENCOUNTER — Other Ambulatory Visit: Payer: Self-pay

## 2022-01-19 ENCOUNTER — Ambulatory Visit
Admission: RE | Admit: 2022-01-19 | Discharge: 2022-01-19 | Disposition: A | Discharge status: Home / Self Care | Payer: Medicare Other | Source: Ambulatory Visit | Attending: Radiation Oncology | Admitting: Radiation Oncology

## 2022-01-19 DIAGNOSIS — C50412 Malignant neoplasm of upper-outer quadrant of left female breast: Secondary | ICD-10-CM | POA: Diagnosis not present

## 2022-01-19 LAB — RAD ONC ARIA SESSION SUMMARY
Course Elapsed Days: 2
Plan Fractions Treated to Date: 3
Plan Prescribed Dose Per Fraction: 2.66 Gy
Plan Total Fractions Prescribed: 16
Plan Total Prescribed Dose: 42.56 Gy
Reference Point Dosage Given to Date: 7.98 Gy
Reference Point Session Dosage Given: 2.66 Gy
Session Number: 3

## 2022-01-20 ENCOUNTER — Ambulatory Visit
Admission: RE | Admit: 2022-01-20 | Discharge: 2022-01-20 | Disposition: A | Discharge status: Home / Self Care | Payer: Medicare Other | Source: Ambulatory Visit | Attending: Radiation Oncology | Admitting: Radiation Oncology

## 2022-01-20 ENCOUNTER — Other Ambulatory Visit: Payer: Self-pay

## 2022-01-20 DIAGNOSIS — C50412 Malignant neoplasm of upper-outer quadrant of left female breast: Secondary | ICD-10-CM

## 2022-01-20 LAB — RAD ONC ARIA SESSION SUMMARY
Course Elapsed Days: 3
Plan Fractions Treated to Date: 4
Plan Prescribed Dose Per Fraction: 2.66 Gy
Plan Total Fractions Prescribed: 16
Plan Total Prescribed Dose: 42.56 Gy
Reference Point Dosage Given to Date: 10.64 Gy
Reference Point Session Dosage Given: 2.66 Gy
Session Number: 4

## 2022-01-20 MED ORDER — RADIAPLEXRX EX GEL: Freq: Once | CUTANEOUS | Status: AC

## 2022-01-20 MED ORDER — ALRA NON-METALLIC DEODORANT (RAD-ONC)
1.0000 | Freq: Once | TOPICAL | Status: AC
  Administered 2022-01-20: 1 via TOPICAL

## 2022-01-20 NOTE — Progress Notes (Signed)
Pt here for patient teaching. Pt given Radiation and You booklet, skin care instructions, Alra deodorant, and Radiaplex gel. Reviewed areas of pertinence such as fatigue, nausea and vomiting, skin changes, breast tenderness, breast swelling, and taste changes. Pt able to give teach back of to pat skin, use unscented/gentle soap, and drink plenty of water, apply Radiaplex bid, apply Sonafine bid, avoid applying anything to skin within 4 hours of treatment, avoid wearing an under wire bra, and to use an electric razor if they must shave. Pt verbalizes understanding of information given and will contact nursing with any questions or concerns.    [email or web address]

## 2022-01-23 ENCOUNTER — Other Ambulatory Visit: Payer: Self-pay

## 2022-01-23 ENCOUNTER — Ambulatory Visit
Admission: RE | Admit: 2022-01-23 | Discharge: 2022-01-23 | Disposition: A | Discharge status: Home / Self Care | Payer: Medicare Other | Source: Ambulatory Visit | Attending: Radiation Oncology | Admitting: Radiation Oncology

## 2022-01-23 DIAGNOSIS — C50412 Malignant neoplasm of upper-outer quadrant of left female breast: Secondary | ICD-10-CM | POA: Diagnosis not present

## 2022-01-23 LAB — RAD ONC ARIA SESSION SUMMARY
Course Elapsed Days: 6
Plan Fractions Treated to Date: 5
Plan Prescribed Dose Per Fraction: 2.66 Gy
Plan Total Fractions Prescribed: 16
Plan Total Prescribed Dose: 42.56 Gy
Reference Point Dosage Given to Date: 13.3 Gy
Reference Point Session Dosage Given: 2.66 Gy
Session Number: 5

## 2022-01-24 ENCOUNTER — Other Ambulatory Visit: Payer: Self-pay

## 2022-01-24 ENCOUNTER — Ambulatory Visit
Admission: RE | Admit: 2022-01-24 | Discharge: 2022-01-24 | Disposition: A | Discharge status: Home / Self Care | Payer: Medicare Other | Source: Ambulatory Visit | Attending: Radiation Oncology | Admitting: Radiation Oncology

## 2022-01-24 DIAGNOSIS — C50412 Malignant neoplasm of upper-outer quadrant of left female breast: Secondary | ICD-10-CM | POA: Diagnosis not present

## 2022-01-24 LAB — RAD ONC ARIA SESSION SUMMARY
Course Elapsed Days: 7
Plan Fractions Treated to Date: 6
Plan Prescribed Dose Per Fraction: 2.66 Gy
Plan Total Fractions Prescribed: 16
Plan Total Prescribed Dose: 42.56 Gy
Reference Point Dosage Given to Date: 15.96 Gy
Reference Point Session Dosage Given: 2.66 Gy
Session Number: 6

## 2022-01-25 ENCOUNTER — Ambulatory Visit
Admission: RE | Admit: 2022-01-25 | Discharge: 2022-01-25 | Disposition: A | Discharge status: Home / Self Care | Payer: Medicare Other | Source: Ambulatory Visit | Attending: Radiation Oncology | Admitting: Radiation Oncology

## 2022-01-25 ENCOUNTER — Other Ambulatory Visit: Payer: Self-pay

## 2022-01-25 DIAGNOSIS — C50412 Malignant neoplasm of upper-outer quadrant of left female breast: Secondary | ICD-10-CM | POA: Diagnosis not present

## 2022-01-25 LAB — RAD ONC ARIA SESSION SUMMARY
Course Elapsed Days: 8
Plan Fractions Treated to Date: 7
Plan Prescribed Dose Per Fraction: 2.66 Gy
Plan Total Fractions Prescribed: 16
Plan Total Prescribed Dose: 42.56 Gy
Reference Point Dosage Given to Date: 18.62 Gy
Reference Point Session Dosage Given: 2.66 Gy
Session Number: 7

## 2022-01-26 ENCOUNTER — Other Ambulatory Visit: Payer: Self-pay

## 2022-01-26 ENCOUNTER — Ambulatory Visit
Admission: RE | Admit: 2022-01-26 | Discharge: 2022-01-26 | Disposition: A | Discharge status: Home / Self Care | Payer: Medicare Other | Source: Ambulatory Visit | Attending: Radiation Oncology | Admitting: Radiation Oncology

## 2022-01-26 DIAGNOSIS — C50412 Malignant neoplasm of upper-outer quadrant of left female breast: Secondary | ICD-10-CM | POA: Diagnosis not present

## 2022-01-26 LAB — RAD ONC ARIA SESSION SUMMARY
Course Elapsed Days: 9
Plan Fractions Treated to Date: 8
Plan Prescribed Dose Per Fraction: 2.66 Gy
Plan Total Fractions Prescribed: 16
Plan Total Prescribed Dose: 42.56 Gy
Reference Point Dosage Given to Date: 21.28 Gy
Reference Point Session Dosage Given: 2.66 Gy
Session Number: 8

## 2022-01-27 ENCOUNTER — Ambulatory Visit
Admission: RE | Admit: 2022-01-27 | Discharge: 2022-01-27 | Disposition: A | Discharge status: Home / Self Care | Payer: Medicare Other | Source: Ambulatory Visit | Attending: Radiation Oncology | Admitting: Radiation Oncology

## 2022-01-27 ENCOUNTER — Other Ambulatory Visit: Payer: Self-pay

## 2022-01-27 DIAGNOSIS — C50412 Malignant neoplasm of upper-outer quadrant of left female breast: Secondary | ICD-10-CM | POA: Diagnosis not present

## 2022-01-27 LAB — RAD ONC ARIA SESSION SUMMARY
Course Elapsed Days: 10
Plan Fractions Treated to Date: 9
Plan Prescribed Dose Per Fraction: 2.66 Gy
Plan Total Fractions Prescribed: 16
Plan Total Prescribed Dose: 42.56 Gy
Reference Point Dosage Given to Date: 23.94 Gy
Reference Point Session Dosage Given: 2.66 Gy
Session Number: 9

## 2022-01-30 ENCOUNTER — Other Ambulatory Visit: Payer: Self-pay

## 2022-01-30 ENCOUNTER — Ambulatory Visit
Admission: RE | Admit: 2022-01-30 | Discharge: 2022-01-30 | Disposition: A | Discharge status: Home / Self Care | Payer: Medicare Other | Source: Ambulatory Visit | Attending: Radiation Oncology | Admitting: Radiation Oncology

## 2022-01-30 DIAGNOSIS — C50412 Malignant neoplasm of upper-outer quadrant of left female breast: Secondary | ICD-10-CM | POA: Diagnosis not present

## 2022-01-30 LAB — RAD ONC ARIA SESSION SUMMARY
Course Elapsed Days: 13
Plan Fractions Treated to Date: 10
Plan Prescribed Dose Per Fraction: 2.66 Gy
Plan Total Fractions Prescribed: 16
Plan Total Prescribed Dose: 42.56 Gy
Reference Point Dosage Given to Date: 26.6 Gy
Reference Point Session Dosage Given: 2.66 Gy
Session Number: 10

## 2022-01-31 ENCOUNTER — Ambulatory Visit
Admission: RE | Admit: 2022-01-31 | Discharge: 2022-01-31 | Disposition: A | Discharge status: Home / Self Care | Payer: Medicare Other | Source: Ambulatory Visit | Attending: Radiation Oncology | Admitting: Radiation Oncology

## 2022-01-31 ENCOUNTER — Other Ambulatory Visit: Payer: Self-pay

## 2022-01-31 DIAGNOSIS — C50412 Malignant neoplasm of upper-outer quadrant of left female breast: Secondary | ICD-10-CM | POA: Diagnosis not present

## 2022-01-31 LAB — RAD ONC ARIA SESSION SUMMARY
Course Elapsed Days: 14
Plan Fractions Treated to Date: 11
Plan Prescribed Dose Per Fraction: 2.66 Gy
Plan Total Fractions Prescribed: 16
Plan Total Prescribed Dose: 42.56 Gy
Reference Point Dosage Given to Date: 29.26 Gy
Reference Point Session Dosage Given: 2.66 Gy
Session Number: 11

## 2022-01-31 MED ORDER — RADIAPLEXRX EX GEL: Freq: Once | CUTANEOUS | Status: AC

## 2022-02-01 ENCOUNTER — Ambulatory Visit
Admission: RE | Admit: 2022-02-01 | Discharge: 2022-02-01 | Disposition: A | Discharge status: Home / Self Care | Payer: Medicare Other | Source: Ambulatory Visit | Attending: Radiation Oncology | Admitting: Radiation Oncology

## 2022-02-01 ENCOUNTER — Other Ambulatory Visit: Payer: Self-pay

## 2022-02-01 DIAGNOSIS — C50412 Malignant neoplasm of upper-outer quadrant of left female breast: Secondary | ICD-10-CM | POA: Diagnosis not present

## 2022-02-01 LAB — RAD ONC ARIA SESSION SUMMARY
Course Elapsed Days: 15
Plan Fractions Treated to Date: 12
Plan Prescribed Dose Per Fraction: 2.66 Gy
Plan Total Fractions Prescribed: 16
Plan Total Prescribed Dose: 42.56 Gy
Reference Point Dosage Given to Date: 31.92 Gy
Reference Point Session Dosage Given: 2.66 Gy
Session Number: 12

## 2022-02-02 ENCOUNTER — Ambulatory Visit
Admission: RE | Admit: 2022-02-02 | Discharge: 2022-02-02 | Disposition: A | Discharge status: Home / Self Care | Payer: Medicare Other | Source: Ambulatory Visit | Attending: Radiation Oncology | Admitting: Radiation Oncology

## 2022-02-02 ENCOUNTER — Other Ambulatory Visit: Payer: Self-pay

## 2022-02-02 DIAGNOSIS — C50412 Malignant neoplasm of upper-outer quadrant of left female breast: Secondary | ICD-10-CM | POA: Diagnosis not present

## 2022-02-02 LAB — RAD ONC ARIA SESSION SUMMARY
Course Elapsed Days: 16
Plan Fractions Treated to Date: 13
Plan Prescribed Dose Per Fraction: 2.66 Gy
Plan Total Fractions Prescribed: 16
Plan Total Prescribed Dose: 42.56 Gy
Reference Point Dosage Given to Date: 34.58 Gy
Reference Point Session Dosage Given: 2.66 Gy
Session Number: 13

## 2022-02-03 ENCOUNTER — Ambulatory Visit
Admission: RE | Admit: 2022-02-03 | Discharge: 2022-02-03 | Disposition: A | Discharge status: Home / Self Care | Payer: Medicare Other | Source: Ambulatory Visit | Attending: Radiation Oncology | Admitting: Radiation Oncology

## 2022-02-03 ENCOUNTER — Other Ambulatory Visit: Payer: Self-pay

## 2022-02-03 ENCOUNTER — Ambulatory Visit: Payer: Medicare Other | Admitting: Radiation Oncology

## 2022-02-03 DIAGNOSIS — C50412 Malignant neoplasm of upper-outer quadrant of left female breast: Secondary | ICD-10-CM | POA: Diagnosis not present

## 2022-02-03 LAB — RAD ONC ARIA SESSION SUMMARY
Course Elapsed Days: 17
Plan Fractions Treated to Date: 14
Plan Prescribed Dose Per Fraction: 2.66 Gy
Plan Total Fractions Prescribed: 16
Plan Total Prescribed Dose: 42.56 Gy
Reference Point Dosage Given to Date: 37.24 Gy
Reference Point Session Dosage Given: 2.66 Gy
Session Number: 14

## 2022-02-06 ENCOUNTER — Other Ambulatory Visit: Payer: Self-pay

## 2022-02-06 ENCOUNTER — Ambulatory Visit
Admission: RE | Admit: 2022-02-06 | Discharge: 2022-02-06 | Disposition: A | Discharge status: Home / Self Care | Payer: Medicare Other | Source: Ambulatory Visit | Attending: Radiation Oncology | Admitting: Radiation Oncology

## 2022-02-06 DIAGNOSIS — C50412 Malignant neoplasm of upper-outer quadrant of left female breast: Secondary | ICD-10-CM | POA: Diagnosis not present

## 2022-02-06 LAB — RAD ONC ARIA SESSION SUMMARY
Course Elapsed Days: 20
Plan Fractions Treated to Date: 15
Plan Prescribed Dose Per Fraction: 2.66 Gy
Plan Total Fractions Prescribed: 16
Plan Total Prescribed Dose: 42.56 Gy
Reference Point Dosage Given to Date: 39.9 Gy
Reference Point Session Dosage Given: 2.66 Gy
Session Number: 15

## 2022-02-07 ENCOUNTER — Other Ambulatory Visit: Payer: Self-pay

## 2022-02-07 ENCOUNTER — Ambulatory Visit
Admission: RE | Admit: 2022-02-07 | Discharge: 2022-02-07 | Disposition: A | Discharge status: Home / Self Care | Payer: Medicare Other | Source: Ambulatory Visit | Attending: Radiation Oncology | Admitting: Radiation Oncology

## 2022-02-07 DIAGNOSIS — C50412 Malignant neoplasm of upper-outer quadrant of left female breast: Secondary | ICD-10-CM | POA: Diagnosis not present

## 2022-02-07 LAB — RAD ONC ARIA SESSION SUMMARY
Course Elapsed Days: 21
Plan Fractions Treated to Date: 16
Plan Prescribed Dose Per Fraction: 2.66 Gy
Plan Total Fractions Prescribed: 16
Plan Total Prescribed Dose: 42.56 Gy
Reference Point Dosage Given to Date: 42.56 Gy
Reference Point Session Dosage Given: 2.66 Gy
Session Number: 16

## 2022-02-08 ENCOUNTER — Other Ambulatory Visit: Payer: Self-pay

## 2022-02-08 ENCOUNTER — Ambulatory Visit
Admission: RE | Admit: 2022-02-08 | Discharge: 2022-02-08 | Disposition: A | Discharge status: Home / Self Care | Payer: Medicare Other | Source: Ambulatory Visit | Attending: Radiation Oncology | Admitting: Radiation Oncology

## 2022-02-08 DIAGNOSIS — C50412 Malignant neoplasm of upper-outer quadrant of left female breast: Secondary | ICD-10-CM | POA: Diagnosis not present

## 2022-02-08 LAB — RAD ONC ARIA SESSION SUMMARY
Course Elapsed Days: 22
Plan Fractions Treated to Date: 1
Plan Prescribed Dose Per Fraction: 2 Gy
Plan Total Fractions Prescribed: 4
Plan Total Prescribed Dose: 8 Gy
Reference Point Dosage Given to Date: 2 Gy
Reference Point Session Dosage Given: 2 Gy
Session Number: 17

## 2022-02-13 ENCOUNTER — Other Ambulatory Visit: Payer: Self-pay

## 2022-02-13 ENCOUNTER — Ambulatory Visit
Admission: RE | Admit: 2022-02-13 | Discharge: 2022-02-13 | Disposition: A | Discharge status: Home / Self Care | Payer: Medicare Other | Source: Ambulatory Visit | Attending: Radiation Oncology | Admitting: Radiation Oncology

## 2022-02-13 DIAGNOSIS — C50412 Malignant neoplasm of upper-outer quadrant of left female breast: Secondary | ICD-10-CM | POA: Diagnosis not present

## 2022-02-13 LAB — RAD ONC ARIA SESSION SUMMARY
Course Elapsed Days: 27
Plan Fractions Treated to Date: 2
Plan Prescribed Dose Per Fraction: 2 Gy
Plan Total Fractions Prescribed: 4
Plan Total Prescribed Dose: 8 Gy
Reference Point Dosage Given to Date: 4 Gy
Reference Point Session Dosage Given: 2 Gy
Session Number: 18

## 2022-02-14 ENCOUNTER — Other Ambulatory Visit: Payer: Self-pay

## 2022-02-14 ENCOUNTER — Ambulatory Visit
Admission: RE | Admit: 2022-02-14 | Discharge: 2022-02-14 | Disposition: A | Discharge status: Home / Self Care | Payer: Medicare Other | Source: Ambulatory Visit | Attending: Radiation Oncology | Admitting: Radiation Oncology

## 2022-02-14 DIAGNOSIS — C50412 Malignant neoplasm of upper-outer quadrant of left female breast: Secondary | ICD-10-CM | POA: Diagnosis not present

## 2022-02-14 LAB — RAD ONC ARIA SESSION SUMMARY
Course Elapsed Days: 28
Plan Fractions Treated to Date: 3
Plan Prescribed Dose Per Fraction: 2 Gy
Plan Total Fractions Prescribed: 4
Plan Total Prescribed Dose: 8 Gy
Reference Point Dosage Given to Date: 6 Gy
Reference Point Session Dosage Given: 2 Gy
Session Number: 19

## 2022-02-15 ENCOUNTER — Encounter: Payer: Self-pay | Admitting: *Deleted

## 2022-02-15 ENCOUNTER — Encounter: Payer: Self-pay | Admitting: Radiation Oncology

## 2022-02-15 ENCOUNTER — Other Ambulatory Visit: Payer: Self-pay

## 2022-02-15 ENCOUNTER — Ambulatory Visit
Admission: RE | Admit: 2022-02-15 | Discharge: 2022-02-15 | Disposition: A | Discharge status: Home / Self Care | Payer: Medicare Other | Source: Ambulatory Visit | Attending: Radiation Oncology | Admitting: Radiation Oncology

## 2022-02-15 DIAGNOSIS — C50412 Malignant neoplasm of upper-outer quadrant of left female breast: Secondary | ICD-10-CM

## 2022-02-15 LAB — RAD ONC ARIA SESSION SUMMARY
Course Elapsed Days: 29
Plan Fractions Treated to Date: 4
Plan Prescribed Dose Per Fraction: 2 Gy
Plan Total Fractions Prescribed: 4
Plan Total Prescribed Dose: 8 Gy
Reference Point Dosage Given to Date: 8 Gy
Reference Point Session Dosage Given: 2 Gy
Session Number: 20

## 2022-02-15 NOTE — Progress Notes (Signed)
                                                                                                                                                            Patient Name: ALMETTA LIDDICOAT MRN: [id number] DOB: November 02, 1955 Referring Physician: Clyde Lundborg (Profile Not Attached) Date of Service: 02/15/2022 [facility], [place]                                                        End Of Treatment Note  Diagnoses: C50.412-Malignant neoplasm of upper-outer quadrant of left female breast  Cancer Staging:  Stage IA, cT1bN0M0, grade 1, ER/PR positive, invasive ductal carcinoma of the left breast   Intent: Curative  Radiation Treatment Dates: 01/17/2022 through 02/15/2022 Site Technique Total Dose (Gy) Dose per Fx (Gy) Completed Fx Beam Energies  Breast, Left: Breast_L 3D 42.56/42.56 2.66 16/16 10XFFF  Breast, Left: Breast_L_Bst 3D 8/8 2 4/4 6X, 10X   Narrative: The patient tolerated radiation therapy relatively well. She developed anticipated skin changes in the treatment field.   Plan: The patient will receive a call in about one month from the radiation oncology department. She will continue follow up with Dr. Chryl Heck as well.   ________________________________________________    Carola Rhine, [facility]

## 2022-03-06 ENCOUNTER — Other Ambulatory Visit: Payer: Self-pay

## 2022-03-06 ENCOUNTER — Encounter: Payer: Self-pay | Admitting: Hematology and Oncology

## 2022-03-06 ENCOUNTER — Inpatient Hospital Stay: Payer: Medicare Other | Attending: Hematology and Oncology | Admitting: Hematology and Oncology

## 2022-03-06 VITALS — BP 128/96 | HR 54 | Temp 97.7°F | Resp 18 | Wt 184.7 lb

## 2022-03-06 DIAGNOSIS — Z803 Family history of malignant neoplasm of breast: Secondary | ICD-10-CM | POA: Insufficient documentation

## 2022-03-06 DIAGNOSIS — Z823 Family history of stroke: Secondary | ICD-10-CM | POA: Diagnosis not present

## 2022-03-06 DIAGNOSIS — Z880 Allergy status to penicillin: Secondary | ICD-10-CM | POA: Diagnosis not present

## 2022-03-06 DIAGNOSIS — Z8049 Family history of malignant neoplasm of other genital organs: Secondary | ICD-10-CM | POA: Diagnosis not present

## 2022-03-06 DIAGNOSIS — R202 Paresthesia of skin: Secondary | ICD-10-CM | POA: Insufficient documentation

## 2022-03-06 DIAGNOSIS — Z79899 Other long term (current) drug therapy: Secondary | ICD-10-CM | POA: Diagnosis not present

## 2022-03-06 DIAGNOSIS — C50412 Malignant neoplasm of upper-outer quadrant of left female breast: Secondary | ICD-10-CM | POA: Insufficient documentation

## 2022-03-06 DIAGNOSIS — Z9049 Acquired absence of other specified parts of digestive tract: Secondary | ICD-10-CM | POA: Insufficient documentation

## 2022-03-06 DIAGNOSIS — Z8249 Family history of ischemic heart disease and other diseases of the circulatory system: Secondary | ICD-10-CM | POA: Insufficient documentation

## 2022-03-06 DIAGNOSIS — Z170 Estrogen receptor positive status [ER+]: Secondary | ICD-10-CM | POA: Insufficient documentation

## 2022-03-06 DIAGNOSIS — N6082 Other benign mammary dysplasias of left breast: Secondary | ICD-10-CM | POA: Diagnosis not present

## 2022-03-06 DIAGNOSIS — Z881 Allergy status to other antibiotic agents status: Secondary | ICD-10-CM | POA: Diagnosis not present

## 2022-03-06 DIAGNOSIS — N644 Mastodynia: Secondary | ICD-10-CM | POA: Insufficient documentation

## 2022-03-06 MED ORDER — ANASTROZOLE 1 MG PO TABS: 1.0000 mg | ORAL_TABLET | Freq: Every day | ORAL | 3 refills | Status: DC

## 2022-03-06 NOTE — Assessment & Plan Note (Addendum)
This is a very pleasant 66 year old postmenopausal female patient with left breast invasive ductal carcinoma, grade 1, ER/PR strongly positive, HER2 0, Ki-67 of 5% referred to breast clinic for further recommendations. She is here after adjuvant radiation. She is here to start anastrozole. We have discussed about adverse effects from aromatase inhibitors including but not limited to postmenopausal symptoms such as hot flashes, vaginal dryness, arthralgias, bone loss etc.  She will be getting a baseline bone density scan in January.  Anastrozole information printed and handed over to the patient for further review.   With regards to ongoing breast pain, she will continue gabapentin, she would like to continue observation for now.  Bone density scheduled Apr 11 2022. Encouraged Ca in food, Vit D supplementation and regular exercise. She will RTC in 3 for SCP and tox check RTC in 6 months with me.  Benay Pike MD

## 2022-03-06 NOTE — Addendum Note (Signed)
Addended by: Adaline Sill on: 03/06/2022 09:40 AM   Modules accepted: Orders

## 2022-03-06 NOTE — Progress Notes (Signed)
[facility] NOTE  Patient Care Team: Merwyn Katos as PCP - General (Physician Assistant) Mauro Kaufmann, RN as Oncology Nurse Navigator Rockwell Germany, RN as Oncology Nurse Navigator Benay Pike, MD as Consulting Physician (Hematology and Oncology)  CHIEF COMPLAINTS/PURPOSE OF CONSULTATION:  Newly diagnosed breast cancer  HISTORY OF PRESENTING ILLNESS:  Sara House 66 y.o. female is here because of recent diagnosis of left breast cancer  I reviewed her records extensively and collaborated the history with the patient.  SUMMARY OF ONCOLOGIC HISTORY: Oncology History  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 ([place])  11/03/2021 Mammogram   Screening mammogram showed possible asymmetry in the left breast warranting further evaluation.  No suspicious findings in the right breast.  Diagnostic mammogram of the left confirmed irregular mixed echogenicity mass with dense posterior caustic shadowing in the 2:00 location of the left breast 6 cm from the nipple measuring about 0.3 x 0.6 x 0.4 cm.  Left axilla is negative for adenopathy   11/22/2021 Pathology Results   Left breast needle core biopsy showed invasive ductal carcinoma, grade 1 along with focal DCIS, no evidence of lymphovascular invasion.  Prognostic showed ER +100% strong staining, PR 85% positive strong staining, Ki-67 of 5% and HER2 0 by [facility]   11/28/2021 Initial Diagnosis   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 ([place])   11/28/2021 Cancer Staging   Staging form: Breast, AJCC 8th Edition - Clinical stage from 11/28/2021: Stage IA (cT1b, cN0, cM0, G1, ER+, PR+, HER2-) - Signed by Hayden Pedro, PA-C on 11/29/2021 Stage prefix: Initial diagnosis Method of lymph node assessment: Clinical Histologic grading system: 3 grade system   12/14/2021 Definitive Surgery   She is now status post left breast lumpectomy  which showed invasive ductal carcinoma, grade 1, 1.2 cm in greatest dimension, DCIS, solid and cribriform type, grade 2, calcifications associated with invasive carcinoma, negative for lymphovascular invasion.  1 lymph node negative for malignancy.  Left breast margin excision showed benign breast tissue with incidental small intraductal papilloma, usual ductal hyperplasia, fibrocystic change and apocrine metaplasia.  Negative for malignancy.  Left axillary lymph nodes negative for malignancy.  Previous prognostics showed ER 100% strong PR 85% strong HER2 -0, Ki-67 of 5%   12/14/2021 Oncotype testing   Oncotype DX of 10, distant recurrence risk at 9 years of 3% and group average absolute chemotherapy benefit in this age group less than 1%.    She continues to have some soreness in the left surgical scar and around the nipple. Its so sensitive patient cannot even wear a bra. Radiation gave her bad skin burn especially with the boost. Gabapentin has helped with the tingling. She is understandably concerned about the ongoing discomfort in the area. She is willing to try anastrozole, doesn't prefer tamoxifen because risk of DVT/PE Rest of the pertinent 10 point ROS reviewed and negative  MEDICAL HISTORY:  Past Medical History:  Diagnosis Date   Allergies    Asthma    Breast cancer ([place]) 11/22/2021   right breast IDC   GERD (gastroesophageal reflux disease)    History of COVID-19    HLD (hyperlipidemia)    HTN (hypertension)    Hypothyroidism    Pneumonia 2022   covid pneumonia   Sinus congestion     SURGICAL HISTORY: Past Surgical History:  Procedure Laterality Date   ABDOMINAL HYSTERECTOMY     BREAST LUMPECTOMY WITH  RADIOACTIVE SEED AND SENTINEL LYMPH NODE BIOPSY Left 12/14/2021   Procedure: LEFT BREAST LUMPECTOMY WITH RADIOACTIVE SEED AND SENTINEL LYMPH NODE BIOPSY;  Surgeon: Jovita Kussmaul, MD;  Location: [facility];  Service: General;  Laterality: Left;   CHOLECYSTECTOMY      DUPUYTREN CONTRACTURE RELEASE     GALLBLADDER SURGERY     LAPAROSCOPIC HYSTERECTOMY     left rotator cuff repair Left     SOCIAL HISTORY: Social History   Socioeconomic History   Marital status: Married    Spouse name: Not on file   Number of children: Not on file   Years of education: Not on file   Highest education level: Not on file  Occupational History   Not on file  Tobacco Use   Smoking status: Never   Smokeless tobacco: Never  Substance and Sexual Activity   Alcohol use: Never   Drug use: Never   Sexual activity: Yes    Birth control/protection: Surgical    Comment: hyst  Other Topics Concern   Not on file  Social History Narrative   Not on file   Social Determinants of Health   Financial Resource Strain: Not on file  Food Insecurity: Not on file  Transportation Needs: Not on file  Physical Activity: Not on file  Stress: Not on file  Social Connections: Not on file  Intimate Partner Violence: Not on file    FAMILY HISTORY: Family History  Problem Relation Age of Onset   CVA Mother    Stroke Mother    Heart failure Mother    Hypertension Mother    Uterine cancer Mother    CVA Father    Heart disease Father    Breast cancer Sister     ALLERGIES:  is allergic to cefdinir, levofloxacin, penicillins, and doxycycline.  MEDICATIONS:  Current Outpatient Medications  Medication Sig Dispense Refill   aspirin EC 81 MG tablet Take 81 mg by mouth daily. Swallow whole.     Budeson-Glycopyrrol-Formoterol (BREZTRI AEROSPHERE) 160-9-4.8 MCG/ACT AERO Inhale 2 puffs into the lungs in the morning and at bedtime. 10.7 g 11   cetirizine (ZYRTEC) 10 MG tablet Take 10 mg by mouth daily.     Cholecalciferol 25 MCG (1000 UT) tablet Take 1,000 Units by mouth daily.     cyanocobalamin 1000 MCG tablet Take 1,000 mcg by mouth daily.     docusate sodium (COLACE) 100 MG capsule Take 100 mg by mouth 2 (two) times daily.     levothyroxine (SYNTHROID) 50 MCG tablet Take 50  mcg by mouth daily before breakfast.     losartan (COZAAR) 50 MG tablet Take 50 mg by mouth daily.     lovastatin (MEVACOR) 40 MG tablet Take 40 mg by mouth at bedtime.     Lysine 1000 MG TABS Take by mouth.     MELATONIN PO Take 10 mg by mouth.     mometasone (NASONEX) 50 MCG/ACT nasal spray Place 2 sprays into the nose daily. 1 each 11   montelukast (SINGULAIR) 10 MG tablet Take 1 tablet by mouth daily.     Omega-3 Fatty Acids (FISH OIL) 1000 MG CAPS Take 2,000 mg by mouth.     OMEPRAZOLE PO Take 40 mg by mouth.     oxyCODONE (ROXICODONE) 5 MG immediate release tablet Take 1 tablet (5 mg total) by mouth every 6 (six) hours as needed for severe pain. 10 tablet 0   propranolol (INDERAL) 80 MG tablet Take 80 mg by mouth  at bedtime.     No current facility-administered medications for this visit.    REVIEW OF SYSTEMS:   Constitutional: Denies fevers, chills or abnormal night sweats Eyes: Denies blurriness of vision, double vision or watery eyes Ears, nose, mouth, throat, and face: Denies mucositis or sore throat Respiratory: Denies cough, dyspnea or wheezes Cardiovascular: Denies palpitation, chest discomfort or lower extremity swelling Gastrointestinal:  Denies nausea, heartburn or change in bowel habits Skin: Denies abnormal skin rashes Lymphatics: Denies new lymphadenopathy or easy bruising Neurological:Denies numbness, tingling or new weaknesses Behavioral/Psych: Mood is stable, no new changes  Breast: Denies any palpable lumps or discharge All other systems were reviewed with the patient and are negative.  PHYSICAL EXAMINATION: ECOG PERFORMANCE STATUS: 0 - Asymptomatic  Vitals:   03/06/22 0904  BP: (!) 128/96  Pulse: (!) 54  Resp: 18  Temp: 97.7 F (36.5 C)  SpO2: 98%    Filed Weights   03/06/22 0904  Weight: 184 lb 11.2 oz (83.8 kg)   Physical Exam Constitutional:      Appearance: Normal appearance.  Neurological:     Mental Status: She is alert.       LABORATORY DATA:  I have reviewed the data as listed Lab Results  Component Value Date   WBC 6.4 10/21/2008   HGB 14.0 10/21/2008   HCT 42.6 10/21/2008   MCV 88.3 10/21/2008   PLT 201 10/21/2008   Lab Results  Component Value Date   NA 140 10/21/2008   K 4.4 10/21/2008   CL 106 10/21/2008   CO2 28 10/21/2008    RADIOGRAPHIC STUDIES: I have personally reviewed the radiological reports and agreed with the findings in the report.  ASSESSMENT AND PLAN:  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 [facility]) This is a very pleasant 66 year old postmenopausal female patient with left breast invasive ductal carcinoma, grade 1, ER/PR strongly positive, HER2 0, Ki-67 of 5% referred to breast clinic for further recommendations. She is here after adjuvant radiation. She is here to start anastrozole. We have discussed about adverse effects from aromatase inhibitors including but not limited to postmenopausal symptoms such as hot flashes, vaginal dryness, arthralgias, bone loss etc.  She will be getting a baseline bone density scan in January.  Anastrozole information printed and handed over to the patient for further review.   With regards to ongoing breast pain, she will continue gabapentin, she would like to continue observation for now.  Bone density scheduled Apr 11 2022. Encouraged Ca in food, Vit D supplementation and regular exercise. She will RTC in 3 for SCP and tox check RTC in 6 months with me.  Benay Pike MD    Total time spent: 30 minutes All questions were answered. The patient knows to call the clinic with any problems, questions or concerns.    Benay Pike, MD 03/06/22

## 2022-03-27 ENCOUNTER — Ambulatory Visit
Admission: RE | Admit: 2022-03-27 | Discharge: 2022-03-27 | Disposition: A | Discharge status: Home / Self Care | Payer: Medicare PPO | Source: Ambulatory Visit | Attending: Radiation Oncology | Admitting: Radiation Oncology

## 2022-03-27 ENCOUNTER — Ambulatory Visit: Payer: Medicare PPO | Attending: General Surgery

## 2022-03-27 VITALS — Wt 185.4 lb

## 2022-03-27 DIAGNOSIS — Z483 Aftercare following surgery for neoplasm: Secondary | ICD-10-CM

## 2022-03-27 NOTE — Progress Notes (Signed)
  Radiation Oncology         [phone]) [phone] ________________________________  Name: Sara House MRN: [id number]  Date of Service: 03/27/2022  DOB: Feb 17, 1956  Post Treatment Telephone Note  Diagnosis:  Stage IA, cT1bN0M0, grade 1, ER/PR positive, invasive ductal carcinoma of the left breast   Intent: Curative  Radiation Treatment Dates: 01/17/2022 through 02/15/2022 Site Technique Total Dose (Gy) Dose per Fx (Gy) Completed Fx Beam Energies  Breast, Left: Breast_L 3D 42.56/42.56 2.66 16/16 10XFFF  Breast, Left: Breast_L_Bst        (as documented in provider EOT note)   The patient was available for call today.   Symptoms of fatigue have improved since completing therapy.  Symptoms of skin changes have improved since completing therapy but patient is having some tenderness at the incision line 6/10.  The patient was encouraged to avoid sun exposure in the area of prior treatment for up to one year following radiation with either sunscreen or by the style of clothing worn in the sun.  The patient has scheduled follow up with her medical oncologist Dr. Chryl Heck for ongoing surveillance, and was encouraged to call if she develops concerns or questions regarding radiation.  This concludes the interview.   Leandra Kern, LPN

## 2022-03-27 NOTE — Therapy (Signed)
  OUTPATIENT PHYSICAL THERAPY SOZO SCREENING NOTE   Patient Name: Sara House MRN: [id number] DOB:12-Apr-1955, 67 y.o., female Today's Date: 03/27/2022  PCP: Heywood Bene, PA-C REFERRING PROVIDER: Jovita Kussmaul, MD   PT End of Session - 03/27/22 1005     Visit Number 2   # unchanged due to screen only   PT Start Time 32    PT Stop Time 1012    PT Time Calculation (min) 10 min    Activity Tolerance Patient tolerated treatment well    Behavior During Therapy [facility] for tasks assessed/performed             Past Medical History:  Diagnosis Date   Allergies    Asthma    Breast cancer ([place]) 11/22/2021   right breast IDC   GERD (gastroesophageal reflux disease)    History of COVID-19    HLD (hyperlipidemia)    HTN (hypertension)    Hypothyroidism    Pneumonia 2022   covid pneumonia   Sinus congestion    Past Surgical History:  Procedure Laterality Date   ABDOMINAL HYSTERECTOMY     BREAST LUMPECTOMY WITH RADIOACTIVE SEED AND SENTINEL LYMPH NODE BIOPSY Left 12/14/2021   Procedure: LEFT BREAST LUMPECTOMY WITH RADIOACTIVE SEED AND SENTINEL LYMPH NODE BIOPSY;  Surgeon: Jovita Kussmaul, MD;  Location: [place];  Service: General;  Laterality: Left;   CHOLECYSTECTOMY     DUPUYTREN CONTRACTURE RELEASE     GALLBLADDER SURGERY     LAPAROSCOPIC HYSTERECTOMY     left rotator cuff repair Left    Patient Active Problem List   Diagnosis Date Noted   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 ([place]) 11/28/2021   Asthma 12/30/2020   Allergic rhinitis 12/30/2020   ABDOMINAL PAIN 04/01/2009   GERD 03/09/2009   IRRITABLE BOWEL SYNDROME 03/09/2009   DYSPHAGIA UNSPECIFIED 03/09/2009    REFERRING DIAG: left breast cancer at risk for lymphedema  THERAPY DIAG:  Aftercare following surgery for neoplasm  PERTINENT HISTORY: Had left lumpectomy and SLNB on 12/14/21 due to [facility] of the left breast. 2 lymph nodes removed. Tumor  markers unknown at this time.  Hx of left surgery RCR   PRECAUTIONS: left UE Lymphedema risk, None  SUBJECTIVE: Pt returns for her first 3 month L-Dex screen.   PAIN:  Are you having pain? No  SOZO SCREENING: Patient was assessed today using the SOZO machine to determine the lymphedema index score. This was compared to her baseline score. It was determined that she is within the recommended range when compared to her baseline and no further action is needed at this time. She will continue SOZO screenings. These are done every 3 months for 2 years post operatively followed by every 6 months for 2 years, and then annually.   L-DEX FLOWSHEETS - 03/27/22 1000       L-DEX LYMPHEDEMA SCREENING   Measurement Type Unilateral    L-DEX MEASUREMENT EXTREMITY Upper Extremity    POSITION  Standing    DOMINANT SIDE Left    At Risk Side Left    BASELINE SCORE (UNILATERAL) -3.9    L-DEX SCORE (UNILATERAL) -8.2    VALUE CHANGE (UNILAT) -4.3              Otelia Limes, PTA 03/27/2022, 10:12 AM

## 2022-04-11 ENCOUNTER — Ambulatory Visit
Admission: RE | Admit: 2022-04-11 | Discharge: 2022-04-11 | Disposition: A | Discharge status: Home / Self Care | Payer: Medicare PPO | Source: Ambulatory Visit | Attending: Physician Assistant | Admitting: Physician Assistant

## 2022-04-11 DIAGNOSIS — E2839 Other primary ovarian failure: Secondary | ICD-10-CM

## 2022-04-14 ENCOUNTER — Encounter: Payer: Self-pay | Admitting: *Deleted

## 2022-05-30 ENCOUNTER — Telehealth: Payer: Self-pay | Admitting: Pulmonary Disease

## 2022-05-30 NOTE — Telephone Encounter (Signed)
I called PT and she would like to find a replacement for Singulair. It is too expensive now. Pls call her to advise her @ [phone]

## 2022-05-30 NOTE — Telephone Encounter (Signed)
Can we run a ticket on patients insurance to see what else is preferred besides singular.  Please route message back to triage

## 2022-05-31 ENCOUNTER — Other Ambulatory Visit (HOSPITAL_COMMUNITY): Payer: Self-pay

## 2022-05-31 NOTE — Telephone Encounter (Signed)
Per test claim 30 tablets of generic Singulair is $1.92

## 2022-06-01 NOTE — Telephone Encounter (Signed)
Called patient but she did not answer. Left message for her to call back.  

## 2022-06-05 ENCOUNTER — Inpatient Hospital Stay: Payer: Medicare PPO | Attending: Hematology and Oncology | Admitting: Adult Health

## 2022-06-05 ENCOUNTER — Encounter: Payer: Self-pay | Admitting: Adult Health

## 2022-06-05 ENCOUNTER — Other Ambulatory Visit: Payer: Self-pay

## 2022-06-05 VITALS — BP 150/103 | HR 58 | Temp 97.5°F | Resp 16 | Ht 63.0 in | Wt 187.7 lb

## 2022-06-05 DIAGNOSIS — N6082 Other benign mammary dysplasias of left breast: Secondary | ICD-10-CM | POA: Diagnosis not present

## 2022-06-05 DIAGNOSIS — Z79811 Long term (current) use of aromatase inhibitors: Secondary | ICD-10-CM | POA: Insufficient documentation

## 2022-06-05 DIAGNOSIS — Z90710 Acquired absence of both cervix and uterus: Secondary | ICD-10-CM | POA: Insufficient documentation

## 2022-06-05 DIAGNOSIS — Z79899 Other long term (current) drug therapy: Secondary | ICD-10-CM | POA: Insufficient documentation

## 2022-06-05 DIAGNOSIS — Z9049 Acquired absence of other specified parts of digestive tract: Secondary | ICD-10-CM | POA: Insufficient documentation

## 2022-06-05 DIAGNOSIS — C50412 Malignant neoplasm of upper-outer quadrant of left female breast: Secondary | ICD-10-CM | POA: Diagnosis not present

## 2022-06-05 DIAGNOSIS — R5383 Other fatigue: Secondary | ICD-10-CM | POA: Insufficient documentation

## 2022-06-05 DIAGNOSIS — Z881 Allergy status to other antibiotic agents status: Secondary | ICD-10-CM | POA: Diagnosis not present

## 2022-06-05 DIAGNOSIS — M85851 Other specified disorders of bone density and structure, right thigh: Secondary | ICD-10-CM | POA: Diagnosis not present

## 2022-06-05 DIAGNOSIS — Z8249 Family history of ischemic heart disease and other diseases of the circulatory system: Secondary | ICD-10-CM | POA: Insufficient documentation

## 2022-06-05 DIAGNOSIS — Z803 Family history of malignant neoplasm of breast: Secondary | ICD-10-CM | POA: Diagnosis not present

## 2022-06-05 DIAGNOSIS — M2550 Pain in unspecified joint: Secondary | ICD-10-CM | POA: Insufficient documentation

## 2022-06-05 DIAGNOSIS — Z170 Estrogen receptor positive status [ER+]: Secondary | ICD-10-CM | POA: Insufficient documentation

## 2022-06-05 DIAGNOSIS — I890 Lymphedema, not elsewhere classified: Secondary | ICD-10-CM | POA: Diagnosis not present

## 2022-06-05 DIAGNOSIS — Z880 Allergy status to penicillin: Secondary | ICD-10-CM | POA: Diagnosis not present

## 2022-06-05 DIAGNOSIS — N6012 Diffuse cystic mastopathy of left breast: Secondary | ICD-10-CM | POA: Diagnosis not present

## 2022-06-05 DIAGNOSIS — Z823 Family history of stroke: Secondary | ICD-10-CM | POA: Insufficient documentation

## 2022-06-05 NOTE — Progress Notes (Signed)
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  No suspicious findings in the right breast.  Diagnostic mammogram of the left confirmed irregular mixed echogenicity mass with dense posterior caustic shadowing in the 2:00 location of the left breast 6 cm from the nipple measuring about 0.3 x 0.6 x 0.4 cm.  Left axilla is negative for adenopathy   11/22/2021 Pathology Results   Left breast needle core biopsy showed invasive ductal carcinoma, grade 1 along with focal DCIS, no evidence of lymphovascular invasion.  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 ([place])   11/28/2021 Cancer Staging   Staging form: Breast, AJCC 8th Edition - Clinical stage from 11/28/2021: Stage IA (cT1b, cN0, cM0, G1, ER+, PR+, HER2-) - Signed by Hayden Pedro, PA-C on 11/29/2021 Stage prefix: Initial diagnosis Method of lymph node assessment: Clinical Histologic grading system: 3 grade system   12/14/2021 Definitive Surgery   She is now status post left breast lumpectomy which showed invasive ductal carcinoma, grade 1, 1.2 cm in greatest dimension, DCIS, solid and cribriform type, grade 2, calcifications associated with invasive carcinoma, negative for lymphovascular invasion.  1 lymph node negative for malignancy.  Left breast margin excision showed benign breast tissue with incidental small intraductal papilloma, usual ductal hyperplasia, fibrocystic change and apocrine metaplasia.  Negative for malignancy.  Left axillary lymph nodes negative for malignancy.  Previous prognostics showed ER 100% strong PR 85% strong HER2 -0, Ki-67 of 5%    12/14/2021 Oncotype testing   Oncotype DX of 10, distant recurrence risk at 9 years of 3% and group average absolute chemotherapy benefit in this age group less than 1%.   01/17/2022 - 02/15/2022 Radiation Therapy   Site Technique Total Dose (Gy) Dose per Fx (Gy) Completed Fx Beam Energies  Breast, Left: Breast_L 3D 42.56/42.56 2.66 16/16 10XFFF  Breast, Left: Breast_L_Bst             02/2022 -  Anti-estrogen oral therapy   Anastrozole      INTERVAL HISTORY:  Ms. Higgs to review her survivorship care plan detailing her treatment course for breast cancer, as well as monitoring long-term side effects of that treatment, education regarding health maintenance, screening, and overall wellness and health promotion.     Overall, Ms. Konz reports feeling moderately well.  She is having left breast pain and swelling.  She is also experiencing some achiness and vaginal dryness from the Anastrozole however this is manageable for her and she is motivated to continue on the Anastrozole.  Bone density testing showed osteoporosis. She has optimized her calcium, vitamin D intake and began fosamax.    REVIEW OF SYSTEMS:  Review of Systems  Constitutional:  Positive for fatigue. Negative for appetite change, chills, fever and unexpected weight change.  HENT:   Negative for hearing loss, lump/mass and trouble swallowing.   Eyes:  Negative for eye problems and icterus.  Respiratory:  Negative for chest tightness, cough and shortness of breath.   Cardiovascular:  Negative for chest pain, leg swelling and palpitations.  Gastrointestinal:  Negative for abdominal distention, abdominal pain, constipation, diarrhea, nausea and vomiting.  Endocrine: Negative for hot flashes.  Genitourinary:  Negative for difficulty urinating.   Musculoskeletal:  Positive for arthralgias.  Skin:  Negative for itching and rash.  Neurological:  Negative for dizziness, extremity weakness, headaches and numbness.  Hematological:   Negative for adenopathy. Does not bruise/bleed easily.  Psychiatric/Behavioral:  Negative for depression. The patient is not nervous/anxious.   Breast: Denies any new nodularity, masses, tenderness, nipple changes, or nipple discharge.     PAST MEDICAL/SURGICAL HISTORY:  Past Medical History:  Diagnosis Date   Allergies    Asthma    Breast cancer ([place]) 11/22/2021   right breast IDC   GERD (gastroesophageal reflux disease)    History of COVID-19    HLD (hyperlipidemia)    HTN (hypertension)    Hypothyroidism    Pneumonia 2022   covid pneumonia   Sinus congestion    Past Surgical History:  Procedure Laterality Date   ABDOMINAL HYSTERECTOMY     BREAST LUMPECTOMY WITH RADIOACTIVE SEED AND SENTINEL LYMPH NODE BIOPSY Left 12/14/2021   Procedure: LEFT BREAST LUMPECTOMY WITH RADIOACTIVE SEED AND SENTINEL LYMPH NODE BIOPSY;  Surgeon: Jovita Kussmaul, MD;  Location: [place];  Service: General;  Laterality: Left;   [place]     left rotator cuff repair Left      ALLERGIES:  Allergies  Allergen Reactions   Cefdinir    Levofloxacin Swelling   Penicillins    Doxycycline Rash     CURRENT MEDICATIONS:  Outpatient Encounter Medications as of 06/05/2022  Medication Sig   alendronate (FOSAMAX) 70 MG tablet Take 70 mg by mouth once a week.   anastrozole (ARIMIDEX) 1 MG tablet Take 1 tablet (1 mg total) by mouth daily.   aspirin EC 81 MG tablet Take 81 mg by mouth daily. Swallow whole.   Budeson-Glycopyrrol-Formoterol (BREZTRI AEROSPHERE) 160-9-4.8 MCG/ACT AERO Inhale 2 puffs into the lungs in the morning and at bedtime.   cetirizine (ZYRTEC) 10 MG tablet Take 10 mg by mouth daily.   Cholecalciferol 25 MCG (1000 UT) tablet Take 1,000 Units by mouth daily.   cyanocobalamin 1000 MCG tablet Take 1,000 mcg by mouth daily.   docusate sodium (COLACE) 100 MG capsule Take 100 mg by  mouth 2 (two) times daily.   levothyroxine (SYNTHROID) 50 MCG tablet Take 50 mcg by mouth daily before breakfast.   losartan (COZAAR) 50 MG tablet Take 75 mg by mouth daily.   lovastatin (MEVACOR) 40 MG tablet Take 40 mg by mouth at bedtime.   Lysine 1000 MG TABS Take by mouth.   MELATONIN PO Take 10 mg by mouth.   Omega-3 Fatty Acids (FISH OIL) 1000 MG CAPS Take 2,000 mg by mouth.   OMEPRAZOLE PO Take 40 mg by mouth.   propranolol (INDERAL) 80 MG tablet Take 80 mg by mouth at bedtime.   [DISCONTINUED] mometasone (NASONEX) 50 MCG/ACT nasal spray Place 2 sprays into the nose daily.   [DISCONTINUED] montelukast (SINGULAIR) 10 MG tablet Take 1 tablet by mouth daily.   [DISCONTINUED] oxyCODONE (ROXICODONE) 5 MG immediate release tablet Take 1 tablet (5 mg total) by mouth every 6 (six) hours as needed for severe pain.   No facility-administered encounter medications on file as of 06/05/2022.     ONCOLOGIC FAMILY HISTORY:  Family History  Problem Relation Age of Onset   CVA Mother    Stroke Mother    Heart failure Mother    Hypertension  Mother    Uterine cancer Mother    CVA Father    Heart disease Father    Breast cancer Sister       SOCIAL HISTORY:  Social History   Socioeconomic History   Marital status: Married    Spouse name: Not on file   Number of children: Not on file   Years of education: Not on file   Highest education level: Not on file  Occupational History   Not on file  Tobacco Use   Smoking status: Never   Smokeless tobacco: Never  Substance and Sexual Activity   Alcohol use: Never   Drug use: Never   Sexual activity: Yes    Birth control/protection: Surgical    Comment: hyst  Other Topics Concern   Not on file  Social History Narrative   Not on file   Social Determinants of Health   Financial Resource Strain: Not on file  Food Insecurity: Not on file  Transportation Needs: Not on file  Physical Activity: Not on file  Stress: Not on file  Social  Connections: Not on file  Intimate Partner Violence: Not on file     OBSERVATIONS/OBJECTIVE:  BP (!) 150/103 (BP Location: Right Arm, Patient Position: Sitting) Comment: provider notified  Pulse (!) 58   Temp (!) 97.5 F (36.4 C) (Temporal)   Resp 16   Ht 5\' 3"  (1.6 m)   Wt 187 lb 11.2 oz (85.1 kg)   SpO2 100%   BMI 33.25 kg/m  GENERAL: Patient is a well appearing female in no acute distress HEENT:  Sclerae anicteric.  Oropharynx clear and moist. No ulcerations or evidence of oropharyngeal candidiasis. Neck is supple.  NODES:  No cervical, supraclavicular, or axillary lymphadenopathy palpated.  BREAST EXAM: Left breast status postlumpectomy and radiation no sign of local recurrence, left breast with some mild swelling and pink tent.  Right breast is benign. LUNGS:  Clear to auscultation bilaterally.  No wheezes or rhonchi. HEART:  Regular rate and rhythm. No murmur appreciated. ABDOMEN:  Soft, nontender.  Positive, normoactive bowel sounds. No organomegaly palpated. MSK:  No focal spinal tenderness to palpation. Full range of motion bilaterally in the upper extremities. EXTREMITIES:  No peripheral edema.   SKIN:  Clear with no obvious rashes or skin changes. No nail dyscrasia. NEURO:  Nonfocal. Well oriented.  Appropriate affect.  LABORATORY DATA:  None for this visit.  DIAGNOSTIC IMAGING:  None for this visit.      ASSESSMENT AND PLAN:  Ms.. Haux is a pleasant 67 y.o. female with Stage 1A left breast invasive ductal carcinoma, ER+/PR+/HER2-, diagnosed in 11/2021, treated with lumpectomy, adjuvant radiation therapy, and anti-estrogen therapy with anastrozole beginning in 02/2022.  She presents to the Survivorship Clinic for our initial meeting and routine follow-up post-completion of treatment for breast cancer.    1. Stage 1A left breast cancer:  Ms. Gainous is continuing to recover from definitive treatment for breast cancer. She will follow-up with her medical oncologist, Dr.  Chryl Heck with history and physical exam per surveillance protocol.  She will continue her anti-estrogen therapy with Anastrozole. Thus far, she is tolerating the Anastrozole well, with minimal side effects. Her mammogram is due 10/2021; orders placed today.   Today, a comprehensive survivorship care plan and treatment summary was reviewed with the patient today detailing her breast cancer diagnosis, treatment course, potential late/long-term effects of treatment, appropriate follow-up care with recommendations for the future, and patient education resources.  A copy of this summary, along  with a letter will be sent to the patient's primary care provider via mail/fax/In Basket message after today's visit.    2.Breast lymphedema: referral to PT made today.  She will let me know if this doesn't help and I will order left breast diag mammo and ultrasound to further evaluate.  3. Bone health:  Given Ms. Bertucci's age/history of breast cancer and her current treatment regimen including anti-estrogen therapy with Anastrozole, she is at risk for bone demineralization.  Her last DEXA scan was 04/11/2022, which showed osteoporosis with a t score of -2.6 in the left forearm, and t score of -2.4 in the right femur.  She is on treatment with this from her PCP and is tolerating it well.  She was given education on specific activities to promote bone health.  4. Cancer screening:  Due to Ms. Dobransky's history and her age, she should receive screening for skin cancers, colon cancer, and gynecologic cancers.  The information and recommendations are listed on the patient's comprehensive care plan/treatment summary and were reviewed in detail with the patient.    5. Health maintenance and wellness promotion: Ms. Whitcomb was encouraged to consume 5-7 servings of fruits and vegetables per day. We reviewed the "Nutrition Rainbow" handout.  She was also encouraged to engage in moderate to vigorous exercise for 30 minutes per day most days of  the week.  She was instructed to limit her alcohol consumption and continue to abstain from tobacco use.     6. Support services/counseling: It is not uncommon for this period of the patient's cancer care trajectory to be one of many emotions and stressors.  She was given information regarding our available services and encouraged to contact me with any questions or for help enrolling in any of our support group/programs.    Follow up instructions:    -Return to cancer center in 6 months for f/u with Dr. Chryl Heck  -Mammogram due in 10/2022 -Bone density testing in 03/2024 -She is welcome to return back to the Survivorship Clinic at any time; no additional follow-up needed at this time.  -Consider referral back to survivorship as a long-term survivor for continued surveillance  The patient was provided an opportunity to ask questions and all were answered. The patient agreed with the plan and demonstrated an understanding of the instructions.   Total encounter time:40 minutes*in face-to-face visit time, chart review, lab review, care coordination, order entry, and documentation of the encounter time.   Wilber Bihari, NP 06/05/22 10:54 AM Medical Oncology and Hematology [facility] [place], [place] [address] Tel. [phone]    Fax. [phone]  *Total Encounter Time as defined by the Centers for Medicare and Medicaid Services includes, in addition to the face-to-face time of a patient visit (documented in the note above) non-face-to-face time: obtaining and reviewing outside history, ordering and reviewing medications, tests or procedures, care coordination (communications with other health care professionals or caregivers) and documentation in the medical record.

## 2022-06-07 NOTE — Telephone Encounter (Signed)
ATC X1 LVM for patient to call the office back 

## 2022-06-08 ENCOUNTER — Other Ambulatory Visit: Payer: Self-pay

## 2022-06-08 MED ORDER — MONTELUKAST SODIUM 10 MG PO TABS: 10.0000 mg | ORAL_TABLET | Freq: Every day | ORAL | 11 refills | Status: DC

## 2022-06-08 NOTE — Telephone Encounter (Signed)
Since you have never filled her singular is it okay to send this in? Please advise

## 2022-06-08 NOTE — Telephone Encounter (Signed)
Refill for singular has been sent to patients pharmacy. Nothing further needed.

## 2022-06-11 NOTE — Therapy (Signed)
OUTPATIENT PHYSICAL THERAPY BREAST CANCER EVALUATION   Patient Name: Sara House MRN: [id number] DOB:Jul 05, 1955, 67 y.o., female Today's Date: 06/12/2022   PT End of Session - 06/12/22 0755     Visit Number 1    Authorization Type no insurance info in on eval    PT Start Time 0800    PT Stop Time 0847    PT Time Calculation (min) 47 min    Activity Tolerance Patient tolerated treatment well    Behavior During Therapy [facility] for tasks assessed/performed              Past Medical History:  Diagnosis Date   Allergies    Asthma    Breast cancer ([place]) 11/22/2021   right breast IDC   GERD (gastroesophageal reflux disease)    History of COVID-19    HLD (hyperlipidemia)    HTN (hypertension)    Hypothyroidism    Pneumonia 2022   covid pneumonia   Sinus congestion    Past Surgical History:  Procedure Laterality Date   ABDOMINAL HYSTERECTOMY     BREAST LUMPECTOMY WITH RADIOACTIVE SEED AND SENTINEL LYMPH NODE BIOPSY Left 12/14/2021   Procedure: LEFT BREAST LUMPECTOMY WITH RADIOACTIVE SEED AND SENTINEL LYMPH NODE BIOPSY;  Surgeon: Jovita Kussmaul, MD;  Location: [place];  Service: General;  Laterality: Left;   CHOLECYSTECTOMY     DUPUYTREN CONTRACTURE RELEASE     GALLBLADDER SURGERY     LAPAROSCOPIC HYSTERECTOMY     left rotator cuff repair Left    Patient Active Problem List   Diagnosis Date Noted   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 ([place]) 11/28/2021   Asthma 12/30/2020   Allergic rhinitis 12/30/2020   Seasonal allergic rhinitis due to pollen 08/07/2017   Sensorineural hearing loss (SNHL), bilateral 08/06/2017   Hypothyroidism 05/19/2015   Hypertension 05/19/2015   Hypercholesterolemia 05/19/2015   ABDOMINAL PAIN 04/01/2009   GERD 03/09/2009   IRRITABLE BOWEL SYNDROME 03/09/2009   DYSPHAGIA UNSPECIFIED 03/09/2009    PCP: Clyde Lundborg PA-C  REFERRING PROVIDER: Wilber Bihari, NP  REFERRING  DIAG: Malignant neoplasm of upper-outer quadrant of left female breast, unspecified estrogen receptor status (CMS-HCC  THERAPY DIAG:  Aftercare following surgery for neoplasm  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  It feels like I was beat up in the breast.  I also noticed that the back of the hand has been swollen x 1 week ago.  Somebody at church mentioned that my hand look different.    PERTINENT HISTORY:  Had left lumpectomy and SLNB on 12/14/21 due to [facility] of the left breast. 2 lymph nodes removed. Hx of left RCR.  Completed radiation. On anastrozole. Has osteoporosis.   PATIENT GOALS   reduce lymphedema risk and learn post op HEP.   PAIN:  Are you having pain? Yes 5/10 in the breast up to 8/10 Nothing makes it better or worse  PRECAUTIONS: Lymphedema risk Lt UE  HAND DOMINANCE: left  WEIGHT BEARING RESTRICTIONS No  FALLS:  Has patient fallen in last 6 months? No  LIVING ENVIRONMENT: Patient lives with: spouse  OCCUPATION: retired   LEISURE: none  PRIOR LEVEL OF FUNCTION: Independent   OBJECTIVE  COGNITION:  Overall cognitive status: Within functional limits for tasks assessed    POSTURE:  Forward head and rounded shoulders posture  OBSERVATION/PALPATION:   Wearing compression bra x 1 week.  The left breast has some tenderness near the lumpectomy incision but no fibrosis of lymphedema noted. It may be slightly generally swollen as it is not smaller than expected due to radiation.   +2 ttp around incision with deep  pressure  UPPER EXTREMITY AROM/PROM:   A/PROM LEFT   eval 01/04/22 06/12/22  Shoulder extension 60 65 60  Shoulder flexion 160 155 pull 155 pull  Shoulder abduction 155 153 155  Shoulder internal rotation       Shoulder external rotation 90 95 95                          (Blank rows = not tested)     LYMPHEDEMA ASSESSMENTS:    LANDMARK RIGHT   eval 06/12/22  15cm  35  10 cm proximal to olecranon process 32.5 34.3  Olecranon process 28 29.3  15cm  26.5  10 cm proximal to ulnar styloid process 23.3 23.3  Just proximal to ulnar styloid process 17.7 17.7  Across hand at thumb web space 20.4 20.4  At base of 2nd digit 6.8 6.9  (Blank rows = not tested)   LANDMARK LEFT   eval 01/04/22 06/12/22  15 cm   35  10 cm proximal to olecranon process 32.8 32.8 34  Olecranon process 28 28 29.2  15cm    27  10 cm proximal to ulnar styloid process 23.1 23.2 23.9  Just proximal to ulnar styloid process 17.5 17.6 17.7  Across hand at thumb web space 20.3 20.5 21.6  At base of 2nd digit 7.3 7.5 7.6  (Blank rows = not tested) Length 8cm Volume Rt: 2369ml Volume Lt:  2319ml      L-DEX FLOWSHEETS - 06/12/22 0800       L-DEX LYMPHEDEMA SCREENING   Measurement Type Unilateral    L-DEX MEASUREMENT EXTREMITY Upper Extremity    POSITION  Standing    DOMINANT SIDE Left    At Risk Side Left    BASELINE SCORE (UNILATERAL) -3.9    L-DEX SCORE (UNILATERAL) -5.9    VALUE CHANGE (UNILAT) -2            BREAST COMPLAINTS QUESTIONNAIRE Pain: 6 Heaviness: 5 Swollen feeling: 5 Tense Skin: 6 Redness: 5 Bra Print: 6 Size of Pores:  2 Hard feeling: 8 Total:    43 /80 A Score over 9 indicates lymphedema issues in the breast  TODAY"S TREATMENT: Eval performed for arm and breast Discussed SOZO results and volume measurements which are WNL - and discussed elevation and distal to proximal massage if the hand ever looks swollen.  Gave pt foam rectangle for the lateral bra.   PATIENT  EDUCATION:   Per today's note  HOME EXERCISE PROGRAM:  Use compression and foam  Elevate arm if neded  ASSESSMENT:  CLINICAL IMPRESSION: Pt returns after noting a new onset of breast pain and heaviness.  She scored a 43/80 on the breast complaint questionnaire which is higher than the 9 cutoff for lymphedema.  She has no signs of lymphedema in the arm but does have intermittent back of the hand edema.  We will start treatment with breast MLD education.   Pt will benefit from skilled therapeutic intervention to improve on the following deficits: Decreased knowledge of precautions, impaired UE functional use, pain, decreased ROM, postural dysfunction.   PT treatment/interventions: ADL/self-care home management, pt/family education, therapeutic exercise  REHAB POTENTIAL: Excellent  CLINICAL DECISION MAKING: Stable/uncomplicated  EVALUATION COMPLEXITY: Low   GOALS: Goals reviewed with patient? YES  LONG TERM GOALS: (STG=LTG)    Name Target Date Goal status  1 Pt will return to ind with compression bra and foam use 07/17/22 NEW  2 Pt will be ind with self MLD 07/17/22 NEW  3 Pt will decrease breast pain at rest to 1/10 or less 07/17/22 NEW  4        PLAN: PT FREQUENCY/DURATION: 1x per week x 5 weeks up to 07/17/22  PLAN FOR NEXT SESSION: how was foam? Perform Lt breast MLD and scar tissue desensitization along with PROM starting to educating pt on self MLD  Interventions: manual therapy, therapeutic exercise, self care, re-eval, continued Ander Slade, PT 06/12/2022, 9:34 AM

## 2022-06-12 ENCOUNTER — Encounter: Payer: Self-pay | Admitting: Rehabilitation

## 2022-06-12 ENCOUNTER — Ambulatory Visit: Payer: Medicare PPO | Attending: Adult Health | Admitting: Rehabilitation

## 2022-06-12 ENCOUNTER — Other Ambulatory Visit: Payer: Self-pay

## 2022-06-12 DIAGNOSIS — Z483 Aftercare following surgery for neoplasm: Secondary | ICD-10-CM | POA: Diagnosis not present

## 2022-06-12 DIAGNOSIS — C50412 Malignant neoplasm of upper-outer quadrant of left female breast: Secondary | ICD-10-CM | POA: Diagnosis not present

## 2022-06-12 DIAGNOSIS — I890 Lymphedema, not elsewhere classified: Secondary | ICD-10-CM | POA: Insufficient documentation

## 2022-06-12 DIAGNOSIS — R293 Abnormal posture: Secondary | ICD-10-CM | POA: Diagnosis not present

## 2022-06-19 ENCOUNTER — Ambulatory Visit: Payer: Medicare PPO | Attending: Adult Health

## 2022-06-19 DIAGNOSIS — Z483 Aftercare following surgery for neoplasm: Secondary | ICD-10-CM | POA: Insufficient documentation

## 2022-06-19 DIAGNOSIS — R293 Abnormal posture: Secondary | ICD-10-CM | POA: Diagnosis present

## 2022-06-19 DIAGNOSIS — C50412 Malignant neoplasm of upper-outer quadrant of left female breast: Secondary | ICD-10-CM | POA: Diagnosis present

## 2022-06-19 DIAGNOSIS — I890 Lymphedema, not elsewhere classified: Secondary | ICD-10-CM | POA: Insufficient documentation

## 2022-06-19 NOTE — Patient Instructions (Signed)
Self manual lymph drainage:        Cancer Rehab [id number] Perform this sequence once a day.  Only give enough pressure to your skin to make the skin move.   Hug yourself.  Do circles at your neck just above your collarbones.  Repeat this 10 times.  Diaphragmatic - Supine   Inhale through nose making navel move out toward hands. Exhale through puckered lips, hands follow navel in. Repeat _5__ times. Rest _10__ seconds between repeats.    Axilla - One at a Time   Using full weight of flat hand and fingers at center of uninvolved armpit, make _10__ in-place circles.   Copyright  VHI. All rights reserved.  LEG: Inguinal Nodes Stimulation   With small finger side of hand against hip crease on involved side, gently perform circles at the crease. Repeat __10_ times.   Copyright  VHI. All rights reserved.  Axilla to Inguinal Nodes - Pump   On involved side, sweep _4__ times from armpit along side of trunk to hip crease.  Now gently stretch skin from the involved side to the uninvolved side across the chest at the shoulder line.  Repeat that 4 times.  Draw an imaginary diagonal line from upper outer breast through the nipple area toward lower inner breast.  Direct fluid upward and inward from this line toward the pathway across your upper chest .  Do this in three rows to treat all of the upper inner breast tissue, and do each row 3-4x.      Direct fluid to treat all of lower outer breast tissue downward and outward toward  pathway that is aimed at the left groin.  Finish by doing the pathways as described above going from your involved armpit to the same side groin and going across your upper chest from the involved shoulder to the uninvolved shoulder.  Repeat the steps above where you do circles in your left groin and right armpit. Copyright  VHI. All rights reserved.

## 2022-06-19 NOTE — Therapy (Signed)
OUTPATIENT PHYSICAL THERAPY BREAST CANCER TREATMENT   Patient Name: Sara House MRN: [id number] DOB:October 05, 1955, 68 y.o., female Today's Date: 06/19/2022   PT End of Session - 06/19/22 0806     Visit Number 2    Number of Visits 6    Date for PT Re-Evaluation 07/17/22    Authorization Type [employer] - auth required    PT Start Time 0803    PT Stop Time 0903    PT Time Calculation (min) 60 min    Activity Tolerance Patient tolerated treatment well    Behavior During Therapy [facility] for tasks assessed/performed              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   GERD (gastroesophageal reflux disease)    History of COVID-19    HLD (hyperlipidemia)    HTN (hypertension)    Hypothyroidism    Pneumonia 2022   covid pneumonia   Sinus congestion    Past Surgical History:  Procedure Laterality Date   ABDOMINAL HYSTERECTOMY     BREAST LUMPECTOMY WITH RADIOACTIVE SEED AND SENTINEL LYMPH NODE BIOPSY Left 12/14/2021   Procedure: LEFT BREAST LUMPECTOMY WITH RADIOACTIVE SEED AND SENTINEL LYMPH NODE BIOPSY;  Surgeon: Jovita Kussmaul, MD;  Location: [place];  Service: General;  Laterality: Left;   CHOLECYSTECTOMY     DUPUYTREN CONTRACTURE RELEASE     GALLBLADDER SURGERY     LAPAROSCOPIC HYSTERECTOMY     left rotator cuff repair Left    Patient Active Problem List   Diagnosis Date Noted   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 11/28/2021   Asthma 12/30/2020   Allergic rhinitis 12/30/2020   Seasonal allergic rhinitis due to pollen 08/07/2017   Sensorineural hearing loss (SNHL), bilateral 08/06/2017   Hypothyroidism 05/19/2015   Hypertension 05/19/2015   Hypercholesterolemia 05/19/2015   ABDOMINAL PAIN 04/01/2009   GERD 03/09/2009   IRRITABLE BOWEL SYNDROME 03/09/2009   DYSPHAGIA UNSPECIFIED 03/09/2009    PCP: Clyde Lundborg PA-C  REFERRING  PROVIDER: Wilber Bihari, NP  REFERRING DIAG: Malignant neoplasm of upper-outer quadrant of left female breast, unspecified estrogen receptor status (CMS-HCC  THERAPY DIAG:  Aftercare following surgery for neoplasm  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 My Lt breast just feels really sore, heavy and tight this morning, but no pain.     PERTINENT HISTORY:  Had left lumpectomy and SLNB on 12/14/21 due to [facility] of the left breast. 2 lymph nodes removed. Hx of left RCR.  Completed radiation. On anastrozole. Has osteoporosis.   PATIENT GOALS   reduce lymphedema risk and learn post op HEP.   PAIN:  Are you having pain? No, just sore  PRECAUTIONS: Lymphedema risk Lt UE  HAND DOMINANCE: left  WEIGHT BEARING RESTRICTIONS No  FALLS:  Has patient fallen in last 6 months? No  LIVING ENVIRONMENT: Patient lives with: spouse  OCCUPATION: retired   LEISURE: none  PRIOR LEVEL OF FUNCTION: Independent   OBJECTIVE  COGNITION:  Overall cognitive status: Within functional limits for tasks assessed    POSTURE:  Forward head and rounded shoulders posture  OBSERVATION/PALPATION:   Wearing compression bra x 1 week.  The left breast has some tenderness near the lumpectomy incision but no fibrosis of lymphedema noted. It may be slightly generally swollen as it is not smaller than expected due to radiation.   +2 ttp around incision with deep pressure  UPPER EXTREMITY AROM/PROM:   A/PROM LEFT   eval 01/04/22 06/12/22  Shoulder extension 60 65 60  Shoulder  flexion 160 155 pull 155 pull  Shoulder abduction 155 153 155  Shoulder internal rotation       Shoulder external rotation 90 95 95                          (Blank rows = not tested)     LYMPHEDEMA ASSESSMENTS:    LANDMARK RIGHT   eval 06/12/22  15cm  35  10 cm proximal to olecranon process 32.5 34.3  Olecranon process 28 29.3  15cm  26.5  10 cm proximal to ulnar styloid process 23.3 23.3  Just proximal to ulnar styloid process 17.7 17.7  Across hand at thumb web space 20.4 20.4  At base of 2nd digit 6.8 6.9  (Blank rows = not tested)   LANDMARK LEFT   eval 01/04/22 06/12/22  15 cm   35  10 cm proximal to olecranon process 32.8 32.8 34  Olecranon process 28 28 29.2  15cm    27  10 cm proximal to ulnar styloid process 23.1 23.2 23.9  Just proximal to ulnar styloid process 17.5 17.6 17.7  Across hand at thumb web space 20.3 20.5 21.6  At base of 2nd digit 7.3 7.5 7.6  (Blank rows = not tested) Length 8cm Volume Rt: 232ml Volume Lt:  2355ml       BREAST COMPLAINTS QUESTIONNAIRE Pain: 6 Heaviness: 5 Swollen feeling: 5 Tense Skin: 6 Redness: 5 Bra Print: 6 Size of Pores: 2 Hard feeling: 8 Total:    43 /80 A Score over 9 indicates lymphedema issues in the breast  TODAY'S TREATMENT: 06/19/22: Self care Explained basics of anatomy of lymphatic system with pt and principles of MLD. Also how wearing her compression bra consistently works together with MLD to help reduce her breast lymphedema. Answered pts questions. Also educated pt ok to resume her initial HEP stretches but to be aware of scapular compensation when she performs stretches or ADLs. Demonstrated this for her and she was able to verbalize understanding this as well. Focused on breast MLD instruction this session though so will further review this  with pt at next session.  Manual Therapy MLD: In Supine: Short neck, 5 diaphragmatic breaths, Lt inguinal and Rt axillary nodes, Lt axillo-inguinal and anterior  inter-axillary anastomosis then focused on Lt breast redirecting fluid towards neighboring anastomosis per handout, and issued this at end of session as well.  P/ROM: In Supine to Lt shoulder into flexion and abduction to pts available end motions STM to Lt axilla and scar tissue where pt reports most tenderness and scar tissue palpable.   06/12/22: Eval performed for arm and breast Discussed SOZO results and volume measurements which are WNL - and discussed elevation and distal to proximal massage if the hand ever looks swollen.  Gave pt foam rectangle for the lateral bra.   PATIENT EDUCATION:   Per today's note; self breast MLD  HOME EXERCISE PROGRAM:  06/12/22:Use compression and foam  Elevate arm if needed  06/19/22: Breast self MLD   Pt educated   Demonstration, tactile and verbal cues and handout issued   Pt returned demonstration, tactile and VC's and she will benefit from further review     ASSESSMENT:  CLINICAL IMPRESSION: Began instructing pt in Lt breast self MLD and had her return demo as she is only coming 1x/wk. She was able to return good demo with hand over hand tactile cuing for correct pressure and skin stretch and was able to verbalize good understanding of initial instructions. Also encouraged pt to resume her initial HEP stretches but educated her about scapular depression throughout with demonstration. She will benefit from further review of both at next session.   Pt will benefit from skilled therapeutic intervention to improve on the following deficits: Decreased knowledge of precautions, impaired UE functional use, pain, decreased ROM, postural dysfunction.   PT treatment/interventions: ADL/self-care home management, pt/family education, therapeutic exercise  REHAB POTENTIAL: Excellent  CLINICAL DECISION MAKING: Stable/uncomplicated  EVALUATION COMPLEXITY: Low   GOALS: Goals reviewed with patient? YES  LONG TERM GOALS: (STG=LTG)    Name Target Date Goal  status  1 Pt will return to ind with compression bra and foam use 07/17/22 NEW  2 Pt will be ind with self MLD 07/17/22 NEW  3 Pt will decrease breast pain at rest to 1/10 or less 07/17/22 NEW  4        PLAN: PT FREQUENCY/DURATION: 1x per week x 5 weeks up to 07/17/22  PLAN FOR NEXT SESSION: Cont and review Lt breast MLD and assess pts technique; cont scar tissue desensitization along with PROM of Lt shoulder  Interventions: manual therapy, therapeutic exercise, self care, re-eval, continued SOZO     Otelia Limes, PTA 06/19/2022, 10:05 AM

## 2022-06-26 ENCOUNTER — Ambulatory Visit: Payer: Medicare PPO

## 2022-06-26 ENCOUNTER — Ambulatory Visit: Payer: Medicare PPO | Admitting: Rehabilitation

## 2022-06-26 ENCOUNTER — Encounter: Payer: Self-pay | Admitting: Rehabilitation

## 2022-06-26 DIAGNOSIS — Z483 Aftercare following surgery for neoplasm: Secondary | ICD-10-CM

## 2022-06-26 DIAGNOSIS — C50412 Malignant neoplasm of upper-outer quadrant of left female breast: Secondary | ICD-10-CM

## 2022-06-26 DIAGNOSIS — R293 Abnormal posture: Secondary | ICD-10-CM

## 2022-06-26 DIAGNOSIS — I890 Lymphedema, not elsewhere classified: Secondary | ICD-10-CM

## 2022-06-26 NOTE — Therapy (Signed)
OUTPATIENT PHYSICAL THERAPY BREAST CANCER TREATMENT   Patient Name: Sara House MRN: [id number] DOB:01-08-1956, 67 y.o., female Today's Date: 06/26/2022   PT End of Session - 06/26/22 0759     Visit Number 3    Number of Visits 6    Date for PT Re-Evaluation 07/17/22    Authorization Type 10 visits 06/12/22-07/17/22    Authorization - Visit Number 3    Authorization - Number of Visits 10    PT Start Time 0803    PT Stop Time 0855    PT Time Calculation (min) 52 min    Activity Tolerance Patient tolerated treatment well    Behavior During Therapy [facility] for tasks assessed/performed              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   GERD (gastroesophageal reflux disease)    History of COVID-19    HLD (hyperlipidemia)    HTN (hypertension)    Hypothyroidism    Pneumonia 2022   covid pneumonia   Sinus congestion    Past Surgical History:  Procedure Laterality Date   ABDOMINAL HYSTERECTOMY     BREAST LUMPECTOMY WITH RADIOACTIVE SEED AND SENTINEL LYMPH NODE BIOPSY Left 12/14/2021   Procedure: LEFT BREAST LUMPECTOMY WITH RADIOACTIVE SEED AND SENTINEL LYMPH NODE BIOPSY;  Surgeon: Griselda Miner, MD;  Location: [facility];  Service: General;  Laterality: Left;   CHOLECYSTECTOMY     DUPUYTREN CONTRACTURE RELEASE     GALLBLADDER SURGERY     LAPAROSCOPIC HYSTERECTOMY     left rotator cuff repair Left    Patient Active Problem List   Diagnosis Date Noted   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 11/28/2021   Asthma 12/30/2020   Allergic rhinitis 12/30/2020   Seasonal allergic rhinitis due to pollen 08/07/2017   Sensorineural hearing loss (SNHL), bilateral 08/06/2017   Hypothyroidism 05/19/2015   Hypertension 05/19/2015   Hypercholesterolemia 05/19/2015   ABDOMINAL PAIN 04/01/2009   GERD 03/09/2009   IRRITABLE BOWEL SYNDROME 03/09/2009    DYSPHAGIA UNSPECIFIED 03/09/2009    PCP: Rueben Bash PA-C  REFERRING PROVIDER: Lillard Anes, NP  REFERRING DIAG: Malignant neoplasm of upper-outer quadrant of left female breast, unspecified estrogen receptor status (CMS-HCC  THERAPY DIAG:  Aftercare following surgery for neoplasm  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    PERTINENT HISTORY:  Had left lumpectomy and SLNB [facility] 2 lymph nodes removed. Hx of left RCR.  Completed radiation. On anastrozole. Has osteoporosis.   PATIENT GOALS   reduce lymphedema risk and learn post op HEP.   PAIN:  Are you having pain? No, just sore  PRECAUTIONS: Lymphedema risk Lt UE  HAND DOMINANCE: left  WEIGHT BEARING RESTRICTIONS No  FALLS:  Has patient fallen in last 6 months? No  LIVING ENVIRONMENT: Patient lives with: spouse  OCCUPATION: retired   LEISURE: none  PRIOR LEVEL OF FUNCTION: Independent   OBJECTIVE  COGNITION:  Overall cognitive status: Within functional limits for tasks assessed    POSTURE:  Forward head and rounded shoulders posture  OBSERVATION/PALPATION:   Wearing compression bra x 1 week.  The left breast has some tenderness near the lumpectomy incision but no fibrosis of lymphedema noted. It may be slightly generally swollen as it is not smaller than expected due to radiation.   +2 ttp around incision with deep pressure  UPPER EXTREMITY AROM/PROM:   A/PROM LEFT   eval 01/04/22 06/12/22  Shoulder extension 60 65 60  Shoulder flexion 160 155  pull 155 pull  Shoulder abduction 155 153 155  Shoulder internal rotation       Shoulder external rotation 90 95 95                          (Blank rows = not tested)     LYMPHEDEMA ASSESSMENTS:    LANDMARK RIGHT   eval 06/12/22  15cm  35  10 cm proximal to olecranon process 32.5 34.3  Olecranon process 28 29.3  15cm  26.5  10 cm proximal to ulnar styloid process 23.3 23.3  Just proximal to ulnar styloid process 17.7 17.7  Across hand at thumb web space 20.4 20.4  At base of 2nd digit 6.8 6.9  (Blank rows = not tested)   LANDMARK LEFT   eval 01/04/22 06/12/22  15 cm   35  10 cm proximal to olecranon process 32.8 32.8 34  Olecranon process 28 28 29.2  15cm    27  10 cm proximal to ulnar styloid process 23.1 23.2 23.9  Just proximal to ulnar styloid process 17.5 17.6 17.7  Across hand at thumb web space 20.3 20.5 21.6  At base of 2nd digit 7.3 7.5 7.6  (Blank rows = not tested) Length 8cm Volume Rt: 2363ml Volume Lt:  2377ml       BREAST COMPLAINTS QUESTIONNAIRE Pain: 6 Heaviness: 5 Swollen feeling: 5 Tense Skin: 6 Redness: 5 Bra Print: 6 Size of Pores: 2 Hard feeling: 8 Total:    43 /80 A Score over 9 indicates lymphedema issues in the breast  TODAY'S TREATMENT: Pt permission and consent throughout each step of examination and treatment with modification and draping if requested when working on sensitive areas  06/26/22: Manual Therapy Performed MLD seated in front of the mirror with focus on vcs/tcs and wrote additional notes to clarify instructions.  Also drew picture of the breast with the lines.  MLD by PT: In Supine: Short neck, 5 diaphragmatic breaths, Lt inguinal and Rt axillary nodes, Lt axillo-inguinal and anterior inter-axillary anastomosis then focused on Lt breast redirecting fluid towards neighboring anastomosis. P/ROM: In Supine to Lt shoulder into flexion and abduction to pts available end motions STM to Lt axilla and  scar tissue where pt reports  most tenderness and scar tissue palpable.   06/19/22: Self care Explained basics of anatomy of lymphatic system with pt and principles of MLD. Also how wearing her compression bra consistently works together with MLD to help reduce her breast lymphedema. Answered pts questions. Also educated pt ok to resume her initial HEP stretches but to be aware of scapular compensation when she performs stretches or ADLs. Demonstrated this for her and she was able to verbalize understanding this as well. Focused on breast MLD instruction this session though so will further review this with pt at next session.  Manual Therapy MLD: In Supine: Short neck, 5 diaphragmatic breaths, Lt inguinal and Rt axillary nodes, Lt axillo-inguinal and anterior inter-axillary anastomosis then focused on Lt breast redirecting fluid towards neighboring anastomosis per handout, and issued this at end of session as well.  P/ROM: In Supine to Lt shoulder into flexion and abduction to pts available end motions STM to Lt axilla and scar tissue where pt reports most tenderness and scar tissue palpable.   06/12/22: Eval performed for arm and breast Discussed SOZO results and volume measurements which are WNL - and discussed elevation and distal to proximal massage if the hand ever looks swollen.  Gave pt foam rectangle for the lateral bra.   PATIENT EDUCATION:   Per today's note; self breast MLD  HOME EXERCISE PROGRAM:  06/12/22:Use compression and foam  Elevate arm if needed  06/19/22: Breast self MLD   Pt educated   Demonstration, tactile and verbal cues and handout issued   Pt returned demonstration, tactile and VC's and she will benefit from further review     ASSESSMENT:  CLINICAL IMPRESSION: Pt reports PT sessions help a lot so she will extend her visits out 2-3 more weeks at this time.  Noticed improved scar tissue status and axillary tightness post MT today.  Still needs self massage review.   Pt will benefit from skilled  therapeutic intervention to improve on the following deficits: Decreased knowledge of precautions, impaired UE functional use, pain, decreased ROM, postural dysfunction.   PT treatment/interventions: ADL/self-care home management, pt/family education, therapeutic exercise  REHAB POTENTIAL: Excellent  CLINICAL DECISION MAKING: Stable/uncomplicated  EVALUATION COMPLEXITY: Low   GOALS: Goals reviewed with patient? YES  LONG TERM GOALS: (STG=LTG)    Name Target Date Goal status  1 Pt will return to ind with compression bra and foam use 07/17/22 NEW  2 Pt will be ind with self MLD 07/17/22 NEW  3 Pt will decrease breast pain at rest to 1/10 or less 07/17/22 NEW  4        PLAN: PT FREQUENCY/DURATION: 1x per week x 5 weeks up to 07/17/22  PLAN FOR NEXT SESSION: Cont and review Lt breast MLD and assess pts technique; cont scar tissue desensitization along with PROM of Lt shoulder  Interventions: manual therapy, therapeutic exercise, self care, re-eval, continued Vanessa Ralphs, PT 06/26/2022, 8:58 AM

## 2022-07-03 ENCOUNTER — Encounter: Payer: Self-pay | Admitting: Rehabilitation

## 2022-07-03 ENCOUNTER — Ambulatory Visit: Payer: Medicare PPO | Admitting: Rehabilitation

## 2022-07-03 DIAGNOSIS — R293 Abnormal posture: Secondary | ICD-10-CM

## 2022-07-03 DIAGNOSIS — Z483 Aftercare following surgery for neoplasm: Secondary | ICD-10-CM | POA: Diagnosis not present

## 2022-07-03 DIAGNOSIS — I890 Lymphedema, not elsewhere classified: Secondary | ICD-10-CM

## 2022-07-03 DIAGNOSIS — C50412 Malignant neoplasm of upper-outer quadrant of left female breast: Secondary | ICD-10-CM

## 2022-07-03 NOTE — Therapy (Signed)
OUTPATIENT PHYSICAL THERAPY BREAST CANCER TREATMENT   Patient Name: Sara House MRN: [id number] DOB:06/05/1955, 68 y.o., female Today's Date: 07/03/2022   PT End of Session - 07/03/22 0753     Visit Number 4    Number of Visits 6    Date for PT Re-Evaluation 07/17/22    Authorization - Visit Number 4    Authorization - Number of Visits 10    PT Start Time 0800    PT Stop Time 0848    PT Time Calculation (min) 48 min    Activity Tolerance Patient tolerated treatment well    Behavior During Therapy [facility] for tasks assessed/performed              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   GERD (gastroesophageal reflux disease)    History of COVID-19    HLD (hyperlipidemia)    HTN (hypertension)    Hypothyroidism    Pneumonia 2022   covid pneumonia   Sinus congestion    Past Surgical History:  Procedure Laterality Date   ABDOMINAL HYSTERECTOMY     BREAST LUMPECTOMY WITH RADIOACTIVE SEED AND SENTINEL LYMPH NODE BIOPSY Left 12/14/2021   Procedure: LEFT BREAST LUMPECTOMY WITH RADIOACTIVE SEED AND SENTINEL LYMPH NODE BIOPSY;  Surgeon: Griselda Miner, MD;  Location: [facility];  Service: General;  Laterality: Left;   CHOLECYSTECTOMY     DUPUYTREN CONTRACTURE RELEASE     GALLBLADDER SURGERY     LAPAROSCOPIC HYSTERECTOMY     left rotator cuff repair Left    Patient Active Problem List   Diagnosis Date Noted   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 11/28/2021   Asthma 12/30/2020   Allergic rhinitis 12/30/2020   Seasonal allergic rhinitis due to pollen 08/07/2017   Sensorineural hearing loss (SNHL), bilateral 08/06/2017   Hypothyroidism 05/19/2015   Hypertension 05/19/2015   Hypercholesterolemia 05/19/2015   ABDOMINAL PAIN 04/01/2009   GERD 03/09/2009   IRRITABLE BOWEL SYNDROME 03/09/2009   DYSPHAGIA UNSPECIFIED 03/09/2009    PCP: Rueben Bash PA-C  REFERRING PROVIDER: Lillard Anes, NP  REFERRING DIAG: Malignant neoplasm of upper-outer quadrant of left female breast, unspecified estrogen receptor status (CMS-HCC  THERAPY DIAG:  Abnormal posture  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  It feels good when you do it but not when I do it.  It hurt so bad last night I couldn't sleep but 2 hours.  The whole breast and the armpit.    PERTINENT HISTORY:  Had left lumpectomy and SLNB on 12/14/21 due to [facility] of the left breast. 2 lymph nodes removed. Hx of left RCR.  Completed radiation. On anastrozole. Has osteoporosis.   PATIENT GOALS   reduce lymphedema risk and learn post op HEP.   PAIN:  Are you having pain? 7/10   PRECAUTIONS: Lymphedema risk Lt UE  HAND DOMINANCE: left  WEIGHT BEARING RESTRICTIONS No  FALLS:  Has patient fallen in last 6 months? No  LIVING ENVIRONMENT: Patient lives with: spouse  OCCUPATION: retired   LEISURE: none  PRIOR LEVEL OF FUNCTION: Independent   OBJECTIVE  COGNITION:  Overall cognitive status: Within functional limits for tasks assessed    POSTURE:  Forward head and rounded shoulders posture  OBSERVATION/PALPATION:   Wearing compression bra x 1 week.  The left breast has some tenderness near the lumpectomy incision but no fibrosis of lymphedema noted. It may be slightly generally swollen as it is not smaller than expected due to radiation.   +2 ttp around incision with deep pressure  UPPER EXTREMITY AROM/PROM:   A/PROM  LEFT   eval 01/04/22 06/12/22  Shoulder extension 60 65 60  Shoulder flexion 160 155 pull 155 pull  Shoulder abduction 155 153 155  Shoulder internal rotation       Shoulder external rotation 90 95 95                          (Blank rows = not tested)     LYMPHEDEMA ASSESSMENTS:    LANDMARK RIGHT   eval 06/12/22  15cm  35  10 cm proximal to olecranon process 32.5 34.3  Olecranon process 28 29.3  15cm  26.5  10 cm proximal to ulnar styloid process 23.3 23.3  Just proximal to ulnar styloid process 17.7 17.7  Across hand at thumb web space 20.4 20.4  At base of 2nd digit 6.8 6.9  (Blank rows = not tested)   LANDMARK LEFT   eval 01/04/22 06/12/22  15 cm   35  10 cm proximal to olecranon process 32.8 32.8 34  Olecranon process 28 28 29.2  15cm    27  10 cm proximal to ulnar styloid process 23.1 23.2 23.9  Just proximal to ulnar styloid process 17.5 17.6 17.7  Across hand at thumb web space 20.3 20.5 21.6  At base of 2nd digit 7.3 7.5 7.6  (Blank rows = not tested) Length 8cm Volume Rt: Volume Lt:        BREAST COMPLAINTS QUESTIONNAIRE Pain: 6 Heaviness: 5 Swollen feeling: 5 Tense Skin: 6 Redness: 5 Bra Print: 6 Size of Pores: 2 Hard feeling: 8 Total:    43 /80 A Score over 9 indicates lymphedema issues in the breast  TODAY'S TREATMENT: Pt permission and consent throughout each step of examination and treatment with modification and draping if requested when working on sensitive areas  07/03/22 Manual Therapy Performed MLD seated in front of the mirror with focus on vcs/tcs  MLD by PT: In Supine: Short neck, 5 diaphragmatic breaths, Lt inguinal and Rt axillary nodes, Lt axillo-inguinal and anterior inter-axillary anastomosis then focused on Lt breast redirecting fluid towards neighboring anastomosis. P/ROM:  In Supine to Lt shoulder into flexion and abduction to pts available end motions STM to Lt axilla and scar tissue where pt reports most tenderness  and scar tissue palpable. Ran SOZO again due to visible edema between the knuckles - pt also reports her ankles are a bit more swollen.  Gave pt a size IV gauntlet to trial and pt was shown a glove  06/26/22: Manual Therapy Performed MLD seated in front of the mirror with focus on vcs/tcs and wrote additional notes to clarify instructions.  Also drew picture of the breast with the lines.  MLD by PT: In Supine: Short neck, 5 diaphragmatic breaths, Lt inguinal and Rt axillary nodes, Lt axillo-inguinal and anterior inter-axillary anastomosis then focused on Lt breast redirecting fluid towards neighboring anastomosis. P/ROM: In Supine to Lt shoulder into flexion and abduction to pts available end motions STM to Lt axilla and scar tissue where pt reports most tenderness and scar tissue palpable.   06/19/22: Self care Explained basics of anatomy of lymphatic system with pt and principles of MLD. Also how wearing her compression bra consistently works together with MLD to help reduce her breast lymphedema. Answered pts questions. Also educated pt ok to resume her initial HEP stretches but to be aware of scapular compensation when she performs stretches or ADLs. Demonstrated this for her and she was able to verbalize understanding this as well. Focused on breast MLD instruction this session though so will further review this with pt at next session.  Manual Therapy MLD: In Supine: Short neck, 5 diaphragmatic breaths, Lt inguinal and Rt axillary nodes, Lt axillo-inguinal and anterior inter-axillary anastomosis then focused on Lt breast redirecting fluid towards neighboring anastomosis per handout, and issued this at end of session as well.  P/ROM: In Supine to Lt shoulder into flexion and abduction to pts available end motions STM to Lt axilla and scar tissue where pt reports most tenderness and scar tissue palpable.   06/12/22: Eval performed for arm and breast Discussed SOZO results and volume measurements  which are WNL - and discussed elevation and distal to proximal massage if the hand ever looks swollen.  Gave pt foam rectangle for the lateral bra.   PATIENT EDUCATION:   Per today's note; self breast MLD  HOME EXERCISE PROGRAM:  06/12/22:Use compression and foam  Elevate arm if needed  06/19/22: Breast self MLD   Pt educated   Demonstration, tactile and verbal cues and handout issued   Pt returned demonstration, tactile and VC's and she will benefit from further review     ASSESSMENT:  CLINICAL IMPRESSION: Pt is experiencing increased breast pain lately.  Pt seems to be doing the self MLD correctly without too much pressure.  Discussed radiation fibrosis increasing around the 3-6 month mark.  No sig ttp during MT today but did have tightness at the pectoralis.  Edema is visible between her knuckles but the SOZO is normal and even less than baseline.  Gave pt a gauntlet to trial but may benefit from a full glove.  She also notes increased edema at bil ankles.    Pt will benefit from skilled therapeutic intervention to improve on the following deficits: Decreased knowledge of precautions, impaired UE functional use, pain, decreased ROM, postural dysfunction.   PT treatment/interventions: ADL/self-care home management, pt/family education, therapeutic exercise  REHAB POTENTIAL: Excellent  CLINICAL DECISION MAKING: Stable/uncomplicated  EVALUATION COMPLEXITY: Low   GOALS: Goals reviewed with patient? YES  LONG TERM GOALS: (STG=LTG)    Name Target  Date Goal status  1 Pt will return to ind with compression bra and foam use 07/17/22 NEW  2 Pt will be ind with self MLD 07/17/22 NEW  3 Pt will decrease breast pain at rest to 1/10 or less 07/17/22 NEW  4        PLAN: PT FREQUENCY/DURATION: 1x per week x 5 weeks up to 07/17/22  PLAN FOR NEXT SESSION: how was gaunlet? Need glove? Cont and review Lt breast MLD and assess pts technique; cont scar tissue desensitization along with PROM of  Lt shoulder  Interventions: manual therapy, therapeutic exercise, self care, re-eval, continued Vanessa Ralphs, PT 07/03/2022, 8:49 AM

## 2022-07-05 ENCOUNTER — Encounter: Payer: Self-pay | Admitting: Pulmonary Disease

## 2022-07-05 ENCOUNTER — Ambulatory Visit: Payer: Medicare PPO | Admitting: Pulmonary Disease

## 2022-07-05 VITALS — BP 124/82 | HR 60 | Temp 97.8°F | Ht 63.5 in | Wt 188.4 lb

## 2022-07-05 DIAGNOSIS — J302 Other seasonal allergic rhinitis: Secondary | ICD-10-CM

## 2022-07-05 DIAGNOSIS — Z8616 Personal history of COVID-19: Secondary | ICD-10-CM

## 2022-07-05 DIAGNOSIS — J4530 Mild persistent asthma, uncomplicated: Secondary | ICD-10-CM | POA: Diagnosis not present

## 2022-07-05 DIAGNOSIS — K219 Gastro-esophageal reflux disease without esophagitis: Secondary | ICD-10-CM | POA: Diagnosis not present

## 2022-07-05 NOTE — Patient Instructions (Addendum)
Thank you for visiting Dr. Tonia Brooms at [facility]. Today we recommend the following:  Continue current regimen  See [place] as needed in the future or in 1 year    Please do your part to reduce the spread of COVID-19.

## 2022-07-05 NOTE — Progress Notes (Signed)
Synopsis: Referred in July 2022 for cough, hx of covid by Roger Kill, *  Subjective:   PATIENT ID: Sara House GENDER: female DOB: [birth year], MRN: [id number]  Chief Complaint  Patient presents with   Follow-up    Follow up. Patient has no complaints.     This is a 67 year old female here today with complaints of cough and recurrent sinus congestion.  Every time she gets sick she gets chest congestion.  She had chronic bronchitis for a long time.  She has seasonal allergies.  Currently managed with antihistamine, Zyrtec as well as Singulair on occasion.  She has been placed on Advair in the past but had thrush symptoms and stopped.  She occasionally uses albuterol.  She has been seen in ER in urgent care several times this past year.  Her COVID-19 diagnosis in January caused URI symptoms which lingered for several weeks and months.  She had recurrent exacerbation of symptoms in July was seen in urgent care.  Patient has been treated with antibiotics and steroids several times this year.  She does occasionally feel better after being on prednisone for short period but unfortunately the cough and congestion returned.  She does feel better for about an hour after using her albuterol inhaler.  OV 07/04/2021: Here today for follow-up.  She is doing really well.  Her cough is gone.  She has been using her antihistamines, Zyrtec and Singulair.  Rarely uses her albuterol inhaler.  Well maintained on Breztri at this time.  She is thankful that her cough has gone away and she is living back again in a normal life.  She does state that she has trouble breathing occasionally when she encounters strong smells.  She has been trying to use her maintenance inhaler daily but she was afraid she was going to run out so she has been staggering her doses.  OV 07/05/2022: here today for follow up. Using her antihistamines and singulair.  All overall doing fine.Uses her Breztri..  She states during  her worst seasons she will use it more regularly.  She is trying to avoid smoke exposure which makes her symptoms worse but she is around lots of family members with secondhand smoke.    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   GERD (gastroesophageal reflux disease)    History of COVID-19    HLD (hyperlipidemia)    HTN (hypertension)    Hypothyroidism    Pneumonia 2022   covid pneumonia   Sinus congestion      Family History  Problem Relation Age of Onset   CVA Mother    Stroke Mother    Heart failure Mother    Hypertension Mother    Uterine cancer Mother    CVA Father    Heart disease Father    Breast cancer Sister      Past Surgical History:  Procedure Laterality Date   ABDOMINAL HYSTERECTOMY     BREAST LUMPECTOMY WITH RADIOACTIVE SEED AND SENTINEL LYMPH NODE BIOPSY Left 12/14/2021   Procedure: LEFT BREAST LUMPECTOMY WITH RADIOACTIVE SEED AND SENTINEL LYMPH NODE BIOPSY;  Surgeon: Griselda Miner, MD;  Location: [facility];  Service: General;  Laterality: Left;   CHOLECYSTECTOMY     DUPUYTREN CONTRACTURE RELEASE     GALLBLADDER SURGERY     LAPAROSCOPIC HYSTERECTOMY     left rotator cuff repair Left  Social History   Socioeconomic History   Marital status: Married    Spouse name: Not on file   Number of children: Not on file   Years of education: Not on file   Highest education level: Not on file  Occupational History   Not on file  Tobacco Use   Smoking status: Never   Smokeless tobacco: Never  Substance and Sexual Activity   Alcohol use: Never   Drug use: Never   Sexual activity: Yes    Birth control/protection: Surgical    Comment: hyst  Other Topics Concern   Not on file  Social History Narrative   Not on file   Social Determinants of Health   Financial Resource Strain: Not on file  Food Insecurity: Not on file  Transportation Needs: Not on file  Physical  Activity: Not on file  Stress: Not on file  Social Connections: Not on file  Intimate Partner Violence: Not on file     Allergies  Allergen Reactions   Cefdinir    Levofloxacin Swelling   Penicillins    Doxycycline Rash     Outpatient Medications Prior to Visit  Medication Sig Dispense Refill   alendronate (FOSAMAX) 70 MG tablet Take 70 mg by mouth once a week.     anastrozole (ARIMIDEX) 1 MG tablet Take 1 tablet (1 mg total) by mouth daily. 90 tablet 3   aspirin EC 81 MG tablet Take 81 mg by mouth daily. Swallow whole.     Budeson-Glycopyrrol-Formoterol (BREZTRI AEROSPHERE) 160-9-4.8 MCG/ACT AERO Inhale 2 puffs into the lungs in the morning and at bedtime. 10.7 g 11   cetirizine (ZYRTEC) 10 MG tablet Take 10 mg by mouth daily.     Cholecalciferol 25 MCG (1000 UT) tablet Take 1,000 Units by mouth daily.     cyanocobalamin 1000 MCG tablet Take 1,000 mcg by mouth daily.     docusate sodium (COLACE) 100 MG capsule Take 100 mg by mouth 2 (two) times daily.     levothyroxine (SYNTHROID) 50 MCG tablet Take 50 mcg by mouth daily before breakfast.     losartan (COZAAR) 50 MG tablet Take 75 mg by mouth daily.     lovastatin (MEVACOR) 40 MG tablet Take 40 mg by mouth at bedtime.     Lysine 1000 MG TABS Take by mouth.     MELATONIN PO Take 10 mg by mouth.     montelukast (SINGULAIR) 10 MG tablet Take 1 tablet (10 mg total) by mouth at bedtime. 30 tablet 11   Omega-3 Fatty Acids (FISH OIL) 1000 MG CAPS Take 2,000 mg by mouth.     OMEPRAZOLE PO Take 40 mg by mouth.     propranolol (INDERAL) 80 MG tablet Take 80 mg by mouth at bedtime.     No facility-administered medications prior to visit.    Review of Systems  Constitutional:  Negative for chills, fever, malaise/fatigue and weight loss.  HENT:  Negative for hearing loss, sore throat and tinnitus.   Eyes:  Negative for blurred vision and double vision.  Respiratory:  Negative for cough, hemoptysis, sputum production, shortness of breath,  wheezing and stridor.   Cardiovascular:  Negative for chest pain, palpitations, orthopnea, leg swelling and PND.  Gastrointestinal:  Negative for abdominal pain, constipation, diarrhea, heartburn, nausea and vomiting.  Genitourinary:  Negative for dysuria, hematuria and urgency.  Musculoskeletal:  Negative for joint pain and myalgias.  Skin:  Negative for itching and rash.  Neurological:  Negative for dizziness, tingling, weakness  and headaches.  Endo/Heme/Allergies:  Negative for environmental allergies. Does not bruise/bleed easily.  Psychiatric/Behavioral:  Negative for depression. The patient is not nervous/anxious and does not have insomnia.   All other systems reviewed and are negative.    Objective:  Physical Exam Vitals reviewed.  Constitutional:      General: She is not in acute distress.    Appearance: She is well-developed.  HENT:     Head: Normocephalic and atraumatic.  Eyes:     General: No scleral icterus.    Conjunctiva/sclera: Conjunctivae normal.     Pupils: Pupils are equal, round, and reactive to light.  Neck:     Vascular: No JVD.     Trachea: No tracheal deviation.  Cardiovascular:     Rate and Rhythm: Normal rate and regular rhythm.     Heart sounds: Normal heart sounds. No murmur heard. Pulmonary:     Effort: Pulmonary effort is normal. No tachypnea, accessory muscle usage or respiratory distress.     Breath sounds: No stridor. No wheezing, rhonchi or rales.  Abdominal:     General: There is no distension.     Palpations: Abdomen is soft.     Tenderness: There is no abdominal tenderness.  Musculoskeletal:        General: No tenderness.     Cervical back: Neck supple.  Lymphadenopathy:     Cervical: No cervical adenopathy.  Skin:    General: Skin is warm and dry.     Capillary Refill: Capillary refill takes less than 2 seconds.     Findings: No rash.  Neurological:     Mental Status: She is alert and oriented to person, place, and time.   Psychiatric:        Behavior: Behavior normal.      Vitals:   07/05/22 0943  BP: 124/82  Pulse: 60  Temp: 97.8 F (36.6 C)  TempSrc: Oral  SpO2: 97%  Weight: 188 lb 6.4 oz (85.5 kg)  Height: 5' 3.5" (1.613 m)   97% on RA BMI Readings from Last 3 Encounters:  07/05/22 32.85 kg/m  06/05/22 33.25 kg/m  03/27/22 32.84 kg/m   Wt Readings from Last 3 Encounters:  07/05/22 188 lb 6.4 oz (85.5 kg)  06/05/22 187 lb 11.2 oz (85.1 kg)  03/27/22 185 lb 6 oz (84.1 kg)     CBC    Component Value Date/Time   WBC 6.4 10/21/2008 1000   RBC 4.83 10/21/2008 1000   HGB 14.0 10/21/2008 1000   HCT 42.6 10/21/2008 1000   PLT 201 10/21/2008 1000   MCV 88.3 10/21/2008 1000   MCHC 32.8 10/21/2008 1000   RDW 14.1 10/21/2008 1000   LYMPHSABS 2.3 10/21/2008 1000   MONOABS 0.5 10/21/2008 1000   EOSABS 0.1 10/21/2008 1000   BASOSABS 0.0 10/21/2008 1000      Chest Imaging: No recent chest imaging  Pulmonary Functions Testing Results:    Latest Ref Rng & Units 12/30/2020    2:45 PM  PFT Results  FVC-Pre L 2.05   FVC-Predicted Pre % 67   FVC-Post L 2.09   FVC-Predicted Post % 68   Pre FEV1/FVC % % 82   Post FEV1/FCV % % 83   FEV1-Pre L 1.68   FEV1-Predicted Pre % 72   FEV1-Post L 1.74   DLCO uncorrected ml/min/mmHg 17.30   DLCO UNC% % 90   DLCO corrected ml/min/mmHg 17.30   DLCO COR %Predicted % 90   DLVA Predicted % 115   TLC  L 4.08   TLC % Predicted % 83   RV % Predicted % 90    FeNO:   Pathology:   Echocardiogram:   Heart Catheterization:     Assessment & Plan:     ICD-10-CM   1. Mild persistent asthma, unspecified whether complicated  J45.30     2. Seasonal allergic rhinitis, unspecified trigger  J30.2     3. Gastroesophageal reflux disease without esophagitis  K21.9     4. History of COVID-19  Z86.16       Discussion: This is a 67 year old female seen today for mild persistent asthma symptoms, allergic rhinitis gastroesophageal reflux history  of COVID-19.  Her asthma symptoms are well-controlled with her antihistamine Singulair and Breztri.  Plan: Continue current regimen of Breztri, Singulair and Zyrtec As needed Nasonex throughout her worst seasons. She can follow-up with primary care, I am happy to see her in 1 year if needed.    Current Outpatient Medications:    alendronate (FOSAMAX) 70 MG tablet, Take 70 mg by mouth once a week., Disp: , Rfl:    anastrozole (ARIMIDEX) 1 MG tablet, Take 1 tablet (1 mg total) by mouth daily., Disp: 90 tablet, Rfl: 3   aspirin EC 81 MG tablet, Take 81 mg by mouth daily. Swallow whole., Disp: , Rfl:    Budeson-Glycopyrrol-Formoterol (BREZTRI AEROSPHERE) 160-9-4.8 MCG/ACT AERO, Inhale 2 puffs into the lungs in the morning and at bedtime., Disp: 10.7 g, Rfl: 11   cetirizine (ZYRTEC) 10 MG tablet, Take 10 mg by mouth daily., Disp: , Rfl:    Cholecalciferol 25 MCG (1000 UT) tablet, Take 1,000 Units by mouth daily., Disp: , Rfl:    cyanocobalamin 1000 MCG tablet, Take 1,000 mcg by mouth daily., Disp: , Rfl:    docusate sodium (COLACE) 100 MG capsule, Take 100 mg by mouth 2 (two) times daily., Disp: , Rfl:    levothyroxine (SYNTHROID) 50 MCG tablet, Take 50 mcg by mouth daily before breakfast., Disp: , Rfl:    losartan (COZAAR) 50 MG tablet, Take 75 mg by mouth daily., Disp: , Rfl:    lovastatin (MEVACOR) 40 MG tablet, Take 40 mg by mouth at bedtime., Disp: , Rfl:    Lysine 1000 MG TABS, Take by mouth., Disp: , Rfl:    MELATONIN PO, Take 10 mg by mouth., Disp: , Rfl:    montelukast (SINGULAIR) 10 MG tablet, Take 1 tablet (10 mg total) by mouth at bedtime., Disp: 30 tablet, Rfl: 11   Omega-3 Fatty Acids (FISH OIL) 1000 MG CAPS, Take 2,000 mg by mouth., Disp: , Rfl:    OMEPRAZOLE PO, Take 40 mg by mouth., Disp: , Rfl:    propranolol (INDERAL) 80 MG tablet, Take 80 mg by mouth at bedtime., Disp: , Rfl:    Josephine Igo, DO [facility] 07/05/2022 10:00 AM

## 2022-07-10 ENCOUNTER — Other Ambulatory Visit: Payer: Self-pay

## 2022-07-10 ENCOUNTER — Ambulatory Visit: Payer: Medicare PPO | Admitting: Rehabilitation

## 2022-07-10 ENCOUNTER — Encounter: Payer: Self-pay | Admitting: Rehabilitation

## 2022-07-10 ENCOUNTER — Telehealth: Payer: Self-pay

## 2022-07-10 DIAGNOSIS — Z483 Aftercare following surgery for neoplasm: Secondary | ICD-10-CM

## 2022-07-10 DIAGNOSIS — C50412 Malignant neoplasm of upper-outer quadrant of left female breast: Secondary | ICD-10-CM

## 2022-07-10 DIAGNOSIS — R293 Abnormal posture: Secondary | ICD-10-CM

## 2022-07-10 DIAGNOSIS — I890 Lymphedema, not elsewhere classified: Secondary | ICD-10-CM

## 2022-07-10 NOTE — Telephone Encounter (Signed)
Called patient to let her know that her diagnostic mammogram and ultrasound of left breast will be on [facility]17/[facility] at [facility]. Patient verbalized understanding.

## 2022-07-10 NOTE — Therapy (Signed)
OUTPATIENT PHYSICAL THERAPY BREAST CANCER TREATMENT   Patient Name: Lucresia Simic MRN: [id number] DOB:10-01-1955, 67 y.o., female Today's Date: 07/10/2022   PT End of Session - 07/10/22 0800     Visit Number 5    Number of Visits 6    Date for PT Re-Evaluation 07/17/22    Authorization Type 10 visits 06/12/22-07/17/22    PT Start Time 0800    PT Stop Time 0851    PT Time Calculation (min) 51 min    Activity Tolerance Patient tolerated treatment well    Behavior During Therapy [facility] for tasks assessed/performed               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   GERD (gastroesophageal reflux disease)    History of COVID-19    HLD (hyperlipidemia)    HTN (hypertension)    Hypothyroidism    Pneumonia 2022   covid pneumonia   Sinus congestion    Past Surgical History:  Procedure Laterality Date   ABDOMINAL HYSTERECTOMY     BREAST LUMPECTOMY WITH RADIOACTIVE SEED AND SENTINEL LYMPH NODE BIOPSY Left 12/14/2021   Procedure: LEFT BREAST LUMPECTOMY WITH RADIOACTIVE SEED AND SENTINEL LYMPH NODE BIOPSY;  Surgeon: Griselda Miner, MD;  Location: [facility];  Service: General;  Laterality: Left;   CHOLECYSTECTOMY     DUPUYTREN CONTRACTURE RELEASE     GALLBLADDER SURGERY     LAPAROSCOPIC HYSTERECTOMY     left rotator cuff repair Left    Patient Active Problem List   Diagnosis Date Noted   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 11/28/2021   Asthma 12/30/2020   Allergic rhinitis 12/30/2020   Seasonal allergic rhinitis due to pollen 08/07/2017   Sensorineural hearing loss (SNHL), bilateral 08/06/2017   Hypothyroidism 05/19/2015   Hypertension 05/19/2015   Hypercholesterolemia 05/19/2015   ABDOMINAL PAIN 04/01/2009   GERD 03/09/2009   IRRITABLE BOWEL SYNDROME 03/09/2009   DYSPHAGIA UNSPECIFIED 03/09/2009    PCP: Rueben Bash  PA-C  REFERRING PROVIDER: Lillard Anes, NP  REFERRING DIAG: Malignant neoplasm of upper-outer quadrant of left female breast, unspecified estrogen receptor status (CMS-HCC  THERAPY DIAG:  Abnormal posture  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  I tried the massage.    PERTINENT HISTORY:  Had left lumpectomy and SLNB on 12/14/21 due to [facility] of the left breast. 2 lymph nodes removed. Hx of left RCR.  Completed radiation. On anastrozole. Has osteoporosis. Wearing compression bra and using the pad.    PATIENT GOALS   reduce lymphedema risk and learn post op HEP.   PAIN:  Are you having pain? 6/10   PRECAUTIONS: Lymphedema risk Lt UE  HAND DOMINANCE: left  WEIGHT BEARING RESTRICTIONS No  FALLS:  Has patient fallen in last 6 months? No  LIVING ENVIRONMENT: Patient lives with: spouse  OCCUPATION: retired   LEISURE: none  PRIOR LEVEL OF FUNCTION: Independent   OBJECTIVE  COGNITION:  Overall cognitive status: Within functional limits for tasks assessed    POSTURE:  Forward head and rounded shoulders posture  OBSERVATION/PALPATION:   Wearing compression bra x 1 week.  The left breast has some tenderness near the lumpectomy incision but no fibrosis of lymphedema noted. It may be slightly generally swollen as it is not smaller than expected due to radiation.   +2 ttp around incision with deep pressure  UPPER EXTREMITY AROM/PROM:   A/PROM LEFT   eval 01/04/22 06/12/22  Shoulder extension 60 65 60   Shoulder flexion 160 155 pull 155 pull  Shoulder abduction 155 153 155  Shoulder internal rotation       Shoulder external rotation 90 95 95                          (Blank rows = not tested)     LYMPHEDEMA ASSESSMENTS:    LANDMARK RIGHT   eval 06/12/22  15cm  35  10 cm proximal to olecranon process 32.5 34.3  Olecranon process 28 29.3  15cm  26.5  10 cm proximal to ulnar styloid process 23.3 23.3  Just proximal to ulnar styloid process 17.7 17.7  Across hand at thumb web space 20.4 20.4  At base of 2nd digit 6.8 6.9  (Blank rows = not tested)   LANDMARK LEFT   eval 01/04/22 06/12/22  15 cm   35  10 cm proximal to olecranon process 32.8 32.8 34  Olecranon process 28 28 29.2  15cm    27  10 cm proximal to ulnar styloid process 23.1 23.2 23.9  Just proximal to ulnar styloid process 17.5 17.6 17.7  Across hand at thumb web space 20.3 20.5 21.6  At base of 2nd digit 7.3 7.5 7.6  (Blank rows = not tested) Length 8cm Volume Rt: Volume Lt:        BREAST COMPLAINTS QUESTIONNAIRE Pain: 6 Heaviness: 5 Swollen feeling: 5 Tense Skin: 6 Redness: 5 Bra Print: 6 Size of Pores: 2 Hard feeling: 8 Total:    43 /80 A Score over 9 indicates lymphedema issues in the breast  TODAY'S TREATMENT: Pt permission and consent throughout each step of examination and treatment with modification and draping if requested when working on sensitive areas  07/10/22 Manual Therapy Performed MLD seated in front of the mirror with focus on vcs/tcs  MLD by PT: In Supine: Short neck, 5 diaphragmatic breaths, Lt inguinal and Rt axillary nodes, Lt axillo-inguinal and anterior inter-axillary anastomosis then focused on Lt breast redirecting fluid towards neighboring anastomosis. P/ROM: In Supine to Lt shoulder into flexion and abduction to pts available end motions STM to  Lt axilla and scar tissue where pt reports most tenderness and scar tissue palpable.  07/03/22 Manual  Therapy Performed MLD seated in front of the mirror with focus on vcs/tcs  MLD by PT: In Supine: Short neck, 5 diaphragmatic breaths, Lt inguinal and Rt axillary nodes, Lt axillo-inguinal and anterior inter-axillary anastomosis then focused on Lt breast redirecting fluid towards neighboring anastomosis. P/ROM: In Supine to Lt shoulder into flexion and abduction to pts available end motions STM to Lt axilla and scar tissue where pt reports most tenderness and scar tissue palpable. Ran SOZO again due to visible edema between the knuckles - pt also reports her ankles are a bit more swollen.  Gave pt a size IV gauntlet to trial and pt was shown a glove  06/26/22: Manual Therapy Performed MLD seated in front of the mirror with focus on vcs/tcs and wrote additional notes to clarify instructions.  Also drew picture of the breast with the lines.  MLD by PT: In Supine: Short neck, 5 diaphragmatic breaths, Lt inguinal and Rt axillary nodes, Lt axillo-inguinal and anterior inter-axillary anastomosis then focused on Lt breast redirecting fluid towards neighboring anastomosis. P/ROM: In Supine to Lt shoulder into flexion and abduction to pts available end motions STM to Lt axilla and scar tissue where pt reports most tenderness and scar tissue palpable.   06/19/22: Self care Explained basics of anatomy of lymphatic system with pt and principles of MLD. Also how wearing her compression bra consistently works together with MLD to help reduce her breast lymphedema. Answered pts questions. Also educated pt ok to resume her initial HEP stretches but to be aware of scapular compensation when she performs stretches or ADLs. Demonstrated this for her and she was able to verbalize understanding this as well. Focused on breast MLD instruction this session though so will further review this with pt at next session.  Manual Therapy MLD: In Supine: Short neck, 5 diaphragmatic breaths, Lt inguinal and Rt axillary nodes, Lt  axillo-inguinal and anterior inter-axillary anastomosis then focused on Lt breast redirecting fluid towards neighboring anastomosis per handout, and issued this at end of session as well.  P/ROM: In Supine to Lt shoulder into flexion and abduction to pts available end motions STM to Lt axilla and scar tissue where pt reports most tenderness and scar tissue palpable.   06/12/22: Eval performed for arm and breast Discussed SOZO results and volume measurements which are WNL - and discussed elevation and distal to proximal massage if the hand ever looks swollen.  Gave pt foam rectangle for the lateral bra.   PATIENT EDUCATION:   Per today's note; self breast MLD  HOME EXERCISE PROGRAM:  06/12/22:Use compression and foam  Elevate arm if needed  06/19/22: Breast self MLD   Pt educated   Demonstration, tactile and verbal cues and handout issued   Pt returned demonstration, tactile and VC's and she will benefit from further review     ASSESSMENT:  CLINICAL IMPRESSION: Pt is experiencing increased breast pain lately without change with intervention at this point.  Will send note to lindsey who was going to do imaging if pts pain did not change.     Pt will benefit from skilled therapeutic intervention to improve on the following deficits: Decreased knowledge of precautions, impaired UE functional use, pain, decreased ROM, postural dysfunction.   PT treatment/interventions: ADL/self-care home management, pt/family education, therapeutic exercise  REHAB POTENTIAL: Excellent  CLINICAL DECISION MAKING: Stable/uncomplicated  EVALUATION COMPLEXITY: Low   GOALS: Goals reviewed with  patient? YES  LONG TERM GOALS: (STG=LTG)    Name Target Date Goal status  1 Pt will return to ind with compression bra and foam use 07/17/22 NEW  2 Pt will be ind with self MLD 07/17/22 NEW  3 Pt will decrease breast pain at rest to 1/10 or less 07/17/22 NEW  4        PLAN: PT FREQUENCY/DURATION: 1x per week x  5 weeks up to 07/17/22  PLAN FOR NEXT SESSION:  Cont and review Lt breast MLD and assess pts technique; cont scar tissue desensitization along with PROM of Lt shoulder  Interventions: manual therapy, therapeutic exercise, self care, re-eval, continued Vanessa Ralphs, PT 07/10/2022, 8:52 AM

## 2022-07-17 ENCOUNTER — Encounter: Payer: Self-pay | Admitting: Rehabilitation

## 2022-07-17 ENCOUNTER — Ambulatory Visit
Admission: RE | Admit: 2022-07-17 | Discharge: 2022-07-17 | Disposition: A | Discharge status: Home / Self Care | Payer: Medicare PPO | Source: Ambulatory Visit | Attending: Adult Health | Admitting: Adult Health

## 2022-07-17 ENCOUNTER — Ambulatory Visit: Payer: Medicare PPO | Admitting: Rehabilitation

## 2022-07-17 DIAGNOSIS — C50412 Malignant neoplasm of upper-outer quadrant of left female breast: Secondary | ICD-10-CM

## 2022-07-17 DIAGNOSIS — I890 Lymphedema, not elsewhere classified: Secondary | ICD-10-CM

## 2022-07-17 DIAGNOSIS — Z483 Aftercare following surgery for neoplasm: Secondary | ICD-10-CM

## 2022-07-17 DIAGNOSIS — R293 Abnormal posture: Secondary | ICD-10-CM

## 2022-07-17 NOTE — Therapy (Signed)
OUTPATIENT PHYSICAL THERAPY BREAST CANCER TREATMENT   Patient Name: Sara House MRN: [id number] DOB:31-Oct-1955, 67 y.o., female Today's Date: 07/17/2022   PT End of Session - 07/17/22 0851     Visit Number 6    Number of Visits 6    Authorization Type 10 visits 06/12/22-07/17/22    PT Start Time 0900    PT Stop Time 0945    PT Time Calculation (min) 45 min    Activity Tolerance Patient tolerated treatment well    Behavior During Therapy [facility] for tasks assessed/performed               Past Medical History:  Diagnosis Date   Allergies    Asthma    Breast cancer (HCC) 11/22/2021   right breast IDC   GERD (gastroesophageal reflux disease)    History of COVID-19    HLD (hyperlipidemia)    HTN (hypertension)    Hypothyroidism    Pneumonia 2022   covid pneumonia   Sinus congestion    Past Surgical History:  Procedure Laterality Date   ABDOMINAL HYSTERECTOMY     BREAST LUMPECTOMY WITH RADIOACTIVE SEED AND SENTINEL LYMPH NODE BIOPSY Left 12/14/2021   Procedure: LEFT BREAST LUMPECTOMY WITH RADIOACTIVE SEED AND SENTINEL LYMPH NODE BIOPSY;  Surgeon: Griselda Miner, MD;  Location: [facility];  Service: General;  Laterality: Left;   CHOLECYSTECTOMY     DUPUYTREN CONTRACTURE RELEASE     GALLBLADDER SURGERY     LAPAROSCOPIC HYSTERECTOMY     left rotator cuff repair Left    Patient Active Problem List   Diagnosis Date Noted   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) 11/28/2021   Asthma 12/30/2020   Allergic rhinitis 12/30/2020   Seasonal allergic rhinitis due to pollen 08/07/2017   Sensorineural hearing loss (SNHL), bilateral 08/06/2017   Hypothyroidism 05/19/2015   Hypertension 05/19/2015   Hypercholesterolemia 05/19/2015   ABDOMINAL PAIN 04/01/2009   GERD 03/09/2009   IRRITABLE BOWEL SYNDROME 03/09/2009   DYSPHAGIA UNSPECIFIED 03/09/2009    PCP: Rueben Bash PA-C  REFERRING PROVIDER: Lillard Anes, NP  REFERRING DIAG: Malignant neoplasm of upper-outer quadrant of left female breast, unspecified estrogen receptor status (CMS-HCC  THERAPY DIAG:  Abnormal posture  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   PERTINENT HISTORY:  Had left lumpectomy and SLNB on 12/14/21 due to [facility] of the left breast. 2 lymph nodes removed. Hx of left RCR.  Completed radiation. On anastrozole. Has osteoporosis. Wearing compression bra and using the pad.    PATIENT GOALS   reduce lymphedema risk and learn post op HEP.   PAIN:  Are you having pain? 6/10   PRECAUTIONS: Lymphedema risk Lt UE  HAND DOMINANCE: left  WEIGHT BEARING RESTRICTIONS No  FALLS:  Has patient fallen in last 6 months? No  LIVING ENVIRONMENT: Patient lives with: spouse  OCCUPATION: retired   LEISURE: none  PRIOR LEVEL OF FUNCTION: Independent   OBJECTIVE  COGNITION:  Overall cognitive status: Within functional limits for tasks assessed    POSTURE:  Forward head and rounded shoulders posture  OBSERVATION/PALPATION:   Wearing compression bra x 1 week.  The left breast has some tenderness near the lumpectomy incision but no fibrosis of lymphedema noted. It may be slightly generally swollen as it is not smaller than expected due to radiation.   +2 ttp around incision with deep pressure  UPPER EXTREMITY AROM/PROM:   A/PROM LEFT   eval 01/04/22 06/12/22  Shoulder extension 60 65 60  Shoulder flexion 160 155 pull 155 pull  Shoulder  abduction 155 153 155  Shoulder internal rotation       Shoulder external rotation 90 95 95                          (Blank rows = not tested)     LYMPHEDEMA ASSESSMENTS:    LANDMARK RIGHT   eval 06/12/22  15cm  35  10 cm proximal to olecranon process 32.5 34.3  Olecranon process 28 29.3  15cm  26.5  10 cm proximal to ulnar styloid process 23.3 23.3  Just proximal to ulnar styloid process 17.7 17.7  Across hand at thumb web space 20.4 20.4  At base of 2nd digit 6.8 6.9  (Blank rows = not tested)   LANDMARK LEFT   eval 01/04/22 06/12/22  15 cm   35  10 cm proximal to olecranon process 32.8 32.8 34  Olecranon process 28 28 29.2  15cm    27  10 cm proximal to ulnar styloid process 23.1 23.2 23.9  Just proximal to ulnar styloid process 17.5 17.6 17.7  Across hand at thumb web space 20.3 20.5 21.6  At base of 2nd digit 7.3 7.5 7.6  (Blank rows = not tested) Length 8cm Volume Rt: Volume Lt:        BREAST COMPLAINTS QUESTIONNAIRE (Eval and 07/17/22) Pain:   6  6 Heaviness:  5  5 Swollen feeling: 5  4 Tense Skin:  6  5 Redness:  5  0 Bra Print: 6  5 Size of Pores:  2  0 Hard feeling:  8  5 Total:      43 /80  30/80 A Score over 9 indicates lymphedema issues in the breast  TODAY'S TREATMENT: Pt permission and consent throughout each step of examination and treatment with modification and draping if requested when working on sensitive areas  07/17/22 Manual Therapy Performed MLD seated in front of the mirror with focus on vcs/tcs  MLD by PT: In Supine: Short neck, 5 diaphragmatic breaths, Lt inguinal and Rt axillary nodes, Lt axillo-inguinal and anterior inter-axillary anastomosis then focused on Lt breast redirecting  fluid towards neighboring anastomosis. Then in sidelying posterior interaxillary work and axillo inguinal work.  P/ROM: In Supine to Lt shoulder into flexion and abduction to pts available end motions STM to Lt axilla and scar tissue where pt  reports most tenderness and scar tissue palpable. Discussed POC with imaging today.   07/10/22 Manual Therapy Performed MLD seated in front of the mirror with focus on vcs/tcs  MLD by PT: In Supine: Short neck, 5 diaphragmatic breaths, Lt inguinal and Rt axillary nodes, Lt axillo-inguinal and anterior inter-axillary anastomosis then focused on Lt breast redirecting fluid towards neighboring anastomosis. P/ROM: In Supine to Lt shoulder into flexion and abduction to pts available end motions STM to Lt axilla and scar tissue where pt reports most tenderness and scar tissue palpable.  07/03/22 Manual Therapy Performed MLD seated in front of the mirror with focus on vcs/tcs  MLD by PT: In Supine: Short neck, 5 diaphragmatic breaths, Lt inguinal and Rt axillary nodes, Lt axillo-inguinal and anterior inter-axillary anastomosis then focused on Lt breast redirecting fluid towards neighboring anastomosis. P/ROM: In Supine to Lt shoulder into flexion and abduction to pts available end motions STM to Lt axilla and scar tissue where pt reports most tenderness and scar tissue palpable. Ran SOZO again due to visible edema between the knuckles - pt also reports her ankles are a bit more swollen.  Gave pt a size IV gauntlet to trial and pt was shown a glove  PATIENT EDUCATION:   Per today's note; self breast MLD  HOME EXERCISE PROGRAM:  06/12/22:Use compression and foam  Elevate arm if needed  06/19/22: Breast self MLD   Pt educated   Demonstration, tactile and verbal cues and handout issued   Pt returned demonstration, tactile and VC's and she will benefit from further review     ASSESSMENT:  CLINICAL IMPRESSION:   Pt has had a decreased of the breast complaints questionnaire by 13 points but is still up at 30/80.  She has had no change in the breast pain still at 6/10.  She will be doing some imaging today and then we will either continue with PT or change directions of POC as needed.   Pt will  benefit from skilled therapeutic intervention to improve on the following deficits: Decreased knowledge of precautions, impaired UE functional use, pain, decreased ROM, postural dysfunction.   PT treatment/interventions: ADL/self-care home management, pt/family education, therapeutic exercise  REHAB POTENTIAL: Excellent  CLINICAL DECISION MAKING: Stable/uncomplicated  EVALUATION COMPLEXITY: Low   GOALS: Goals reviewed with patient? YES  LONG TERM GOALS: (STG=LTG)    Name Target Date Goal status  1 Pt will return to ind with compression bra and foam use 07/17/22 NEW  2 Pt will be ind with self MLD 07/17/22 NEW  3 Pt will decrease breast pain at rest to 1/10 or less 07/17/22 NEW  4        PLAN: PT FREQUENCY/DURATION: 1x per week x 5 weeks up to 07/17/22  PLAN FOR NEXT SESSION:  Cont and review Lt breast MLD and assess pts technique; cont scar tissue desensitization along with PROM of Lt shoulder  Interventions: manual therapy, therapeutic exercise, self care, re-eval, continued Vanessa Ralphs, PT 07/17/2022, 10:01 AM

## 2022-07-20 ENCOUNTER — Telehealth: Payer: Self-pay

## 2022-07-20 NOTE — Telephone Encounter (Signed)
Returned Pt's call regarding MM and [place] results. Pt's husband answered and stated that Pt is out at this time. Relayed to husband that NP will call Pt tomorrow morning at 0830 to discuss the results. Husband verbalized understanding and stated he would relay to Pt.

## 2022-07-21 ENCOUNTER — Inpatient Hospital Stay: Payer: Medicare PPO | Attending: Hematology and Oncology | Admitting: Adult Health

## 2022-07-21 DIAGNOSIS — C50412 Malignant neoplasm of upper-outer quadrant of left female breast: Secondary | ICD-10-CM

## 2022-07-21 MED ORDER — NAPROXEN 500 MG PO TABS
500.0000 mg | ORAL_TABLET | Freq: Two times a day (BID) | ORAL | 0 refills | Status: DC
Start: 2022-07-21 — End: 2022-08-18

## 2022-07-21 NOTE — Progress Notes (Signed)
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  I discussed the limitations, risks, security and privacy concerns of performing an evaluation and management service by telephone and the availability of in person appointments.  I also discussed with the patient that there may be a patient responsible charge related to this service. The patient expressed understanding and agreed to proceed.   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  No suspicious findings in the right breast.  Diagnostic mammogram of the left confirmed irregular mixed echogenicity mass with dense posterior caustic shadowing in the 2:00 location of the left breast 6 cm from the nipple measuring about 0.3 x 0.6 x 0.4 cm.  Left axilla is negative for adenopathy   11/22/2021 Pathology Results   Left breast needle core biopsy showed invasive ductal carcinoma, grade 1 along with focal DCIS, no evidence of lymphovascular invasion.  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)   11/28/2021 Cancer Staging   Staging form: Breast, AJCC 8th Edition - Clinical stage from 11/28/2021: Stage IA (cT1b, cN0, cM0, G1, ER+, PR+, HER2-) - Signed by Ronny Bacon, PA-C on 11/29/2021 Stage prefix: Initial diagnosis Method of lymph node assessment: Clinical Histologic grading system: 3 grade system   12/14/2021 Definitive Surgery   She is now status post left breast lumpectomy which showed invasive ductal carcinoma, grade 1, 1.2 cm in greatest dimension, DCIS, solid and cribriform type, grade 2, calcifications associated with invasive carcinoma, negative for lymphovascular invasion.  1 lymph node negative for malignancy.  Left breast margin excision showed benign breast tissue with incidental small intraductal papilloma, usual ductal hyperplasia, fibrocystic change and apocrine metaplasia.  Negative for malignancy.  Left axillary lymph nodes negative for malignancy.  Previous prognostics showed ER 100% strong PR 85% strong HER2 -0, Ki-67 of 5%   12/14/2021 Oncotype testing   Oncotype DX of 10, distant recurrence risk at 9 years of 3% and group average absolute chemotherapy benefit in this age group less than 1%.   01/17/2022 - 02/15/2022 Radiation Therapy   Site Technique Total Dose (Gy) Dose per Fx (Gy) Completed Fx Beam Energies  Breast, Left: Breast_L 3D 42.56/42.56 2.66 16/16 10XFFF  Breast, Left: Breast_L_Bst             02/2022 -  Anti-estrogen oral therapy   Anastrozole      CURRENT THERAPY: Anastrozole   INTERVAL HISTORY: Sara House 67 y.o. female returns for f/u of her breast cancer.  She  continues on Anastrozole daily.  She has continued to have left breast pain. She underwent left breast mammogram and ultrasound on 07/17/2022 that demonstrated fat necrosis.  She talked to the radiologist who recommended she take advil three times a day.  She has done this since  Monday and feels the breast is somewhat better.  She is also seeing PT weekly and wants to know if she should continue seeing PT.     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  MEDICAL HISTORY: Past Medical History:  Diagnosis Date   Allergies    Asthma    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 29th, 2023   Pneumonia 2022   covid pneumonia   Sinus congestion     SURGICAL HISTORY: Past Surgical History:  Procedure Laterality Date   ABDOMINAL HYSTERECTOMY     BREAST LUMPECTOMY WITH RADIOACTIVE SEED AND SENTINEL LYMPH NODE BIOPSY Left 12/14/2021   Procedure: LEFT BREAST LUMPECTOMY WITH RADIOACTIVE SEED AND SENTINEL LYMPH NODE BIOPSY;  Surgeon: Griselda Miner, MD;  Location: [facility];  Service: General;  Laterality: Left;   CHOLECYSTECTOMY     DUPUYTREN CONTRACTURE RELEASE     GALLBLADDER SURGERY     LAPAROSCOPIC HYSTERECTOMY     left rotator cuff repair Left     SOCIAL HISTORY: Social History   Socioeconomic History   Marital status: Married    Spouse name: Not on file   Number of children: Not on file   Years of education: Not on file   Highest education level: Not on file  Occupational History    Not on file  Tobacco Use   Smoking status: Never   Smokeless tobacco: Never  Substance and Sexual Activity   Alcohol use: Never   Drug use: Never   Sexual activity: Yes    Birth control/protection: Surgical    Comment: hyst  Other Topics Concern   Not on file  Social History Narrative   Not on file   Social Determinants of Health   Financial Resource Strain: Not on file  Food Insecurity: Not on file  Transportation Needs: Not on file  Physical Activity: Not on file  Stress: Not on file  Social Connections: Not on file  Intimate Partner Violence: Not on file    FAMILY HISTORY: Family History  Problem Relation Age of Onset   CVA Mother    Stroke Mother    Heart failure Mother    Hypertension Mother    Uterine cancer Mother    CVA Father    Heart disease Father    Breast cancer Sister        unknown age    Review of Systems  Constitutional:  Negative for appetite change, chills, fatigue,  fever and unexpected weight change.  HENT:   Negative for hearing loss, lump/mass and trouble swallowing.   Eyes:  Negative for eye problems and icterus.  Respiratory:  Negative for chest tightness, cough and shortness of breath.   Cardiovascular:  Negative for chest pain, leg swelling and palpitations.  Gastrointestinal:  Negative for abdominal distention, abdominal pain, constipation, diarrhea, nausea and vomiting.  Endocrine: Negative for hot flashes.  Genitourinary:  Negative for difficulty urinating.   Musculoskeletal:  Negative for arthralgias.  Skin:  Negative for itching and rash.  Neurological:  Negative for dizziness, extremity weakness, headaches and numbness.  Hematological:  Negative for adenopathy. Does not bruise/bleed easily.  Psychiatric/Behavioral:  Negative for depression. The patient is not nervous/anxious.       PHYSICAL EXAMINATION Patient sounds well, in  no apparent distress, mood and behavior are normal.  Speech is normal breathing is non labored.     LABORATORY DATA:  CBC    Component Value Date/Time   WBC 6.4 10/21/2008 1000   RBC 4.83 10/21/2008 1000   HGB 14.0 10/21/2008 1000   HCT 42.6 10/21/2008 1000   PLT 201 10/21/2008 1000   MCV 88.3 10/21/2008 1000   MCHC 32.8 10/21/2008 1000   RDW 14.1 10/21/2008 1000   LYMPHSABS 2.3 10/21/2008 1000   MONOABS 0.5 10/21/2008 1000   EOSABS 0.1 10/21/2008 1000   BASOSABS 0.0 10/21/2008 1000    CMP     Component Value Date/Time   NA 140 10/21/2008 1000   K 4.4 10/21/2008 1000   CL 106 10/21/2008 1000   CO2 28 10/21/2008 1000   GLUCOSE 97 10/21/2008 1000   BUN 8 10/21/2008 1000   CREATININE 0.75 10/21/2008 1000   CALCIUM 9.7 10/21/2008 1000   GFRNONAA >60 10/21/2008 1000   GFRAA  10/21/2008 1000    >60        The eGFR has been calculated using the MDRD equation. This calculation has not been validated in all clinical situations. eGFR's persistently <60 mL/min signify possible Chronic Kidney Disease.        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  #1 stage Ia left breast invasive ductal carcinoma: She continues on anastrozole daily and is tolerating it well.  She has no signs of breast cancer recurrence.  #2 left breast pain.  She is status post left breast mammogram and ultrasound that identified fat necrosis.  Since she is tolerated the ibuprofen well and it is improving her pain I sent in naproxen 500 mg p.o. twice daily to take with food.  I let her know that this medication is meant to be temporary to decrease the inflammation.  I recommended that she drink plenty of water while she is taking it and I confirmed her creatinine is normal.  After few weeks I  suggested that she decrease the naproxen to 500 mg daily with food and we will follow-up in 4 weeks to reevaluate her pain and reassess whether she needs to continue the medication.  She will continue to see physical therapy.    Sara House will return in 4 weeks for virtual follow-up to discuss her breast pain.  Follow up instructions:    -Return to cancer center 4 weeks virtual visit f/u about  breast pain   The patient was provided an opportunity to ask questions and all were answered. The patient agreed with the plan and demonstrated an understanding of the instructions.   The patient was advised to call back or seek an in-person evaluation if the symptoms worsen or if the condition fails to improve as anticipated.   I provided 15 minutes of non face-to-face telephone visit time during this encounter, and > 50% was spent counseling as documented under my assessment & plan.  Lillard Anes, NP 07/21/22 8:54 AM Medical Oncology and Hematology [facility] [address] [place], [place] [address] Tel. [phone]    Fax. [phone]

## 2022-07-21 NOTE — Assessment & Plan Note (Signed)
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  #1 stage Ia left breast invasive ductal carcinoma: She continues on anastrozole daily and is tolerating it well.  She has no signs of breast cancer recurrence.  #2 left breast pain.  She is status post left breast mammogram and ultrasound that identified fat necrosis.  Since she is tolerated the ibuprofen well and it is improving her pain I sent in naproxen 500 mg p.o. twice daily to take with food.  I let her know that this medication is meant to be temporary to decrease the inflammation.  I recommended that she drink plenty of water while she is taking it and I confirmed her creatinine is normal.  After few weeks I suggested that she decrease the naproxen to 500 mg daily with food and we will follow-up in 4 weeks to reevaluate her pain and reassess whether she needs to continue the medication.  She will continue to see physical therapy.    Sara House will return in 4 weeks for virtual follow-up to discuss her breast pain.

## 2022-07-26 ENCOUNTER — Telehealth: Payer: Self-pay | Admitting: Adult Health

## 2022-07-26 NOTE — Telephone Encounter (Signed)
Scheduled appointment per 5/3 los. Called [phone] but he phone is not in service. Patient will be mailed an appointment reminder.

## 2022-08-18 ENCOUNTER — Inpatient Hospital Stay (HOSPITAL_BASED_OUTPATIENT_CLINIC_OR_DEPARTMENT_OTHER): Payer: Medicare PPO | Admitting: Adult Health

## 2022-08-18 DIAGNOSIS — N63 Unspecified lump in unspecified breast: Secondary | ICD-10-CM

## 2022-08-18 DIAGNOSIS — C50412 Malignant neoplasm of upper-outer quadrant of left female breast: Secondary | ICD-10-CM | POA: Diagnosis not present

## 2022-08-18 MED ORDER — NAPROXEN 500 MG PO TABS
500.0000 mg | ORAL_TABLET | Freq: Every day | ORAL | 0 refills | Status: DC
Start: 2022-08-18 — End: 2022-12-06

## 2022-08-18 NOTE — Progress Notes (Signed)
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  I discussed the limitations, risks, security and privacy concerns of performing an evaluation and management service by telephone and the availability of in person appointments.  I also discussed with the patient that there may be a patient responsible charge related to this service. The patient expressed understanding and agreed to proceed.   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  No suspicious findings in the right breast.  Diagnostic mammogram of the left confirmed irregular mixed echogenicity mass with dense posterior caustic shadowing in the 2:00 location of the left breast 6 cm from the nipple measuring about 0.3 x 0.6 x 0.4 cm.  Left axilla is negative for adenopathy   11/22/2021 Pathology Results   Left breast needle core biopsy showed invasive ductal carcinoma, grade 1 along with focal DCIS, no evidence of lymphovascular invasion.  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)   11/28/2021 Cancer Staging   Staging form: Breast, AJCC 8th Edition - Clinical stage from 11/28/2021: Stage IA (cT1b, cN0, cM0, G1, ER+, PR+, HER2-) - Signed by Ronny Bacon, PA-C on 11/29/2021 Stage prefix: Initial diagnosis Method of lymph node assessment: Clinical Histologic grading system: 3 grade system   12/14/2021 Definitive Surgery   She is now status post left breast lumpectomy which showed invasive ductal carcinoma, grade 1, 1.2 cm in greatest dimension, DCIS, solid and cribriform type, grade 2, calcifications associated with invasive carcinoma, negative for lymphovascular invasion.  1 lymph node negative for malignancy.  Left breast margin excision showed benign breast tissue with incidental small intraductal papilloma, usual ductal hyperplasia, fibrocystic change and apocrine metaplasia.  Negative for malignancy.  Left axillary lymph nodes negative for malignancy.  Previous prognostics showed ER 100% strong PR 85% strong HER2 -0, Ki-67 of 5%   12/14/2021 Oncotype testing   Oncotype DX of 10, distant recurrence risk at 9 years of 3% and group average absolute chemotherapy benefit in this age group less than 1%.   01/17/2022 - 02/15/2022 Radiation Therapy   Site Technique Total Dose (Gy) Dose per Fx (Gy) Completed Fx Beam Energies  Breast, Left: Breast_L 3D 42.56/42.56 2.66 16/16 10XFFF  Breast, Left: Breast_L_Bst             02/2022 -  Anti-estrogen oral therapy   Anastrozole      CURRENT THERAPY:Anastrozole  INTERVAL HISTORY: Sara House 67 y.o. female returns for f/u of  her breast pain.  She has some breast swelling and has been taking aleve BID which she tells me has helped improve her breast soreness and swelling tremendously.  She still has some soreness in her breast but feels much better.   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  MEDICAL HISTORY: Past Medical History:  Diagnosis Date   Allergies    Asthma    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 29th, 2023   Pneumonia 2022   covid pneumonia   Sinus congestion     SURGICAL HISTORY: Past Surgical History:  Procedure Laterality Date   ABDOMINAL HYSTERECTOMY     BREAST LUMPECTOMY WITH RADIOACTIVE SEED AND SENTINEL LYMPH NODE BIOPSY Left 12/14/2021   Procedure: LEFT BREAST LUMPECTOMY WITH RADIOACTIVE SEED AND SENTINEL LYMPH NODE BIOPSY;  Surgeon: Griselda Miner, MD;  Location: [facility];  Service: General;  Laterality: Left;   CHOLECYSTECTOMY     DUPUYTREN CONTRACTURE RELEASE     GALLBLADDER SURGERY     LAPAROSCOPIC HYSTERECTOMY     left rotator cuff repair Left     SOCIAL HISTORY: Social History   Socioeconomic History   Marital status: Married    Spouse name: Not on file   Number of children: Not on file   Years of education: Not on file   Highest education level: Not on file  Occupational History   Not on file  Tobacco Use   Smoking status: Never   Smokeless tobacco: Never  Substance and Sexual Activity   Alcohol use: Never   Drug use: Never   Sexual  activity: Yes    Birth control/protection: Surgical    Comment: hyst  Other Topics Concern   Not on file  Social History Narrative   Not on file   Social Determinants of Health   Financial Resource Strain: Not on file  Food Insecurity: Not on file  Transportation Needs: Not on file  Physical Activity: Not on file  Stress: Not on file  Social Connections: Not on file  Intimate Partner Violence: Not on file    FAMILY HISTORY: Family History  Problem Relation Age of Onset   CVA Mother    Stroke Mother    Heart failure Mother    Hypertension Mother    Uterine cancer Mother    CVA Father    Heart disease Father    Breast cancer Sister        unknown age    Review of Systems  Constitutional:  Negative for appetite change, chills, fatigue, fever and unexpected weight change.  HENT:   Negative for hearing loss, lump/mass and trouble swallowing.   Eyes:  Negative for eye problems and icterus.  Respiratory:  Negative for chest  tightness, cough and shortness of breath.   Cardiovascular:  Negative for chest pain, leg swelling and palpitations.  Gastrointestinal:  Negative for abdominal distention, abdominal pain, constipation, diarrhea, nausea and vomiting.  Endocrine: Negative for hot flashes.  Genitourinary:  Negative for difficulty urinating.   Musculoskeletal:  Negative for arthralgias.  Skin:  Negative for itching and rash.  Neurological:  Negative for dizziness, extremity weakness, headaches and numbness.  Hematological:  Negative for adenopathy. Does not bruise/bleed easily.  Psychiatric/Behavioral:  Negative for depression. The patient is not nervous/anxious.       PHYSICAL EXAMINATION Patient sounds well she is in no apparent distress.  Breathing is nonlabored speech is normal mood and behavior are normal.   LABORATORY DATA:  CBC    Component Value Date/Time   WBC 6.4 10/21/2008 1000   RBC 4.83 10/21/2008 1000   HGB 14.0 10/21/2008 1000   HCT 42.6 10/21/2008  1000   PLT 201 10/21/2008 1000   MCV 88.3 10/21/2008 1000   MCHC 32.8 10/21/2008 1000   RDW 14.1 10/21/2008 1000   LYMPHSABS 2.3 10/21/2008 1000   MONOABS 0.5 10/21/2008 1000   EOSABS 0.1 10/21/2008 1000   BASOSABS 0.0 10/21/2008 1000    CMP     Component Value Date/Time   NA 140 10/21/2008 1000   K 4.4 10/21/2008 1000   CL 106 10/21/2008 1000   CO2 28 10/21/2008 1000   GLUCOSE 97 10/21/2008 1000   BUN 8 10/21/2008 1000   CREATININE 0.75 10/21/2008 1000   CALCIUM 9.7 10/21/2008 1000   GFRNONAA >60 10/21/2008 1000   GFRAA  10/21/2008 1000    >60        The eGFR has been calculated using the MDRD equation. This calculation has not been validated in all clinical situations. eGFR's persistently <60 mL/min signify possible Chronic Kidney Disease.       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  #1 stage Ia left breast invasive ductal carcinoma: She will continue on anastrozole daily as she is tolerating this well.  #2 left breast pain.  She is status post left breast mammogram and ultrasound that identified fat necrosis.  The naproxen 500 mg p.o. twice daily has substantially improved the inflammation and swelling in her breast.  She will decrease this to 500 mg daily for 30 days and then she will go to over-the-counter 1 tablet of Aleve daily as needed.  Leist referral to physical therapy to see if they could evaluate her to identify whether PT would be helpful in this case.  If the fat necrosis worsens we can consider surgery consultation.  I reviewed this with Harleen in detail and she is in agreement with the plan.  She is due to come back in about 4 months for  follow-up.   Follow up instructions:    -Return to cancer center in 11/2022 for f/u or sooner PRN   The patient was provided an opportunity to ask questions and all were answered. The patient agreed with the plan and demonstrated an understanding of the instructions.   The patient was advised to call back or seek an in-person evaluation if the symptoms worsen or if the condition fails to improve as anticipated.   I provided 15  minutes of non face-to-face telephone visit time during this encounter, and > 50% was spent counseling as documented under my assessment & plan.   Lillard Anes, NP 08/18/22 9:21 AM Medical Oncology and Hematology [facility] [address] [place], [place] [address] Tel. [phone]    Fax. [phone]  *Total Encounter Time as defined by the Centers for Medicare and Medicaid Services includes, in addition to the face-to-face time of a patient visit (documented in the note above) non-face-to-face time: obtaining and reviewing outside history, ordering and reviewing medications, tests or procedures, care coordination (communications with other health care professionals or caregivers) and documentation in the medical record.

## 2022-08-18 NOTE — Assessment & Plan Note (Signed)
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  #1 stage Ia left breast invasive ductal carcinoma: She will continue on anastrozole daily as she is tolerating this well.  #2 left breast pain.  She is status post left breast mammogram and ultrasound that identified fat necrosis.  The naproxen 500 mg p.o. twice daily has substantially improved the inflammation and swelling in her breast.  She will decrease this to 500 mg daily for 30 days and then she will go to over-the-counter 1 tablet of Aleve daily as needed.  Leist referral to physical therapy to see if they could evaluate her to identify whether PT would be helpful in this case.  If the fat necrosis worsens we can consider surgery consultation.  I reviewed this with Coretha in detail and she is in agreement with the plan.  She is due to come back in about 4 months for follow-up.

## 2022-09-04 ENCOUNTER — Ambulatory Visit: Payer: Medicare PPO | Admitting: Rehabilitation

## 2022-09-05 ENCOUNTER — Ambulatory Visit: Payer: BC Managed Care – PPO | Admitting: Hematology and Oncology

## 2022-09-06 ENCOUNTER — Ambulatory Visit: Payer: Medicare PPO | Admitting: Rehabilitation

## 2022-09-10 NOTE — Therapy (Signed)
OUTPATIENT PHYSICAL THERAPY BREAST CANCER TREATMENT   Patient Name: Sara House MRN: [id number] DOB:Mar 14, 1956, 67 y.o., female Today's Date: 09/11/2022   PT End of Session - 09/11/22 0854     Visit Number 1    Number of Visits 7    Date for PT Re-Evaluation 10/30/22    Authorization Type entered 09/11/22    PT Start Time 0800    PT Stop Time 0847    PT Time Calculation (min) 47 min    Activity Tolerance Patient tolerated treatment well    Behavior During Therapy [facility] for tasks assessed/performed                Past Medical History:  Diagnosis Date   Allergies    Asthma    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 29th, 2023   Pneumonia 2022   covid pneumonia   Sinus congestion    Past Surgical History:  Procedure Laterality Date   ABDOMINAL HYSTERECTOMY     BREAST LUMPECTOMY WITH RADIOACTIVE SEED AND SENTINEL LYMPH NODE BIOPSY Left 12/14/2021   Procedure: LEFT BREAST LUMPECTOMY WITH RADIOACTIVE SEED AND SENTINEL LYMPH NODE BIOPSY;  Surgeon: Griselda Miner, MD;  Location: [facility];  Service: General;  Laterality: Left;   CHOLECYSTECTOMY     DUPUYTREN CONTRACTURE RELEASE     GALLBLADDER SURGERY     LAPAROSCOPIC HYSTERECTOMY     left rotator cuff repair Left    Patient Active Problem List   Diagnosis Date Noted   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) 11/28/2021   Asthma 12/30/2020   Allergic rhinitis 12/30/2020   Seasonal allergic rhinitis due to pollen 08/07/2017   Sensorineural hearing loss (SNHL), bilateral 08/06/2017   Hypothyroidism 05/19/2015   Hypertension 05/19/2015   Hypercholesterolemia 05/19/2015   ABDOMINAL PAIN 04/01/2009   GERD 03/09/2009   IRRITABLE BOWEL  SYNDROME 03/09/2009   DYSPHAGIA UNSPECIFIED 03/09/2009    PCP: Rueben Bash PA-C  REFERRING PROVIDER: Lillard Anes, NP  REFERRING DIAG: Malignant neoplasm of upper-outer quadrant of left female breast, unspecified estrogen receptor status (CMS-HCC  THERAPY DIAG:  Abnormal posture  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  If the therapy doesn't help then we have to clean it out again.  The hand is still swollen.    PERTINENT HISTORY:  Had left lumpectomy and SLNB on 12/14/21 due to [facility] of the left breast. 2 lymph nodes removed. Hx of left RCR.  Completed radiation. On anastrozole. Has osteoporosis. Wearing compression bra and using the pad.  Fat necrosis noted in the breast which has been better taking naproxen which makes it only a bit better.  Has a glove and a sleeve.    PATIENT GOALS   reduce lymphedema risk and learn post op HEP.   PAIN:  Are you having pain? 6/10 at rest  Increases with sweeping, cleaning, sleeping on the left side.   More of a burning feeling  PRECAUTIONS: Lymphedema risk Lt UE  HAND DOMINANCE: left  WEIGHT BEARING RESTRICTIONS No  FALLS:  Has patient fallen in last 6 months? No  LIVING ENVIRONMENT: Patient lives with: spouse  OCCUPATION: retired   LEISURE: none  PRIOR LEVEL OF FUNCTION: Independent   OBJECTIVE  COGNITION:  Overall cognitive status: Within functional limits for tasks assessed    POSTURE:  Forward head and rounded  shoulders posture  OBSERVATION/PALPATION:   Wearing compression bra x 1 week.  The left breast has some tenderness near the lumpectomy incision but no fibrosis of lymphedema noted. It may be slightly generally swollen as it is not smaller than expected due to radiation.   +2 ttp around incision with deep pressure  UPPER EXTREMITY AROM/PROM:   A/PROM LEFT   eval 01/04/22 06/12/22 09/11/22  Shoulder extension 60 65 60 60  Shoulder flexion 160 155 pull 155 pull 160 pull  Shoulder abduction 155 153 155 160 pull  Shoulder internal rotation        Shoulder external rotation 90 95 95                           (Blank rows = not tested)     LYMPHEDEMA ASSESSMENTS:    LANDMARK RIGHT   eval 06/12/22  15cm  35  10 cm proximal to olecranon process 32.5 34.3  Olecranon process 28 29.3  15cm  26.5  10 cm proximal to ulnar styloid process 23.3 23.3  Just proximal to ulnar styloid process 17.7 17.7  Across hand at thumb web space 20.4 20.4  At base of 2nd digit 6.8 6.9  (Blank rows = not tested)   LANDMARK LEFT   eval 01/04/22 06/12/22 09/11/22  15 cm   35   10 cm proximal to olecranon process 32.8 32.8 34 33.6  Olecranon process 28 28 29.2 29.4  15cm    27   10 cm proximal to ulnar styloid process 23.1 23.2 23.9 23.6  Just proximal to ulnar styloid process 17.5 17.6 17.7 17.9  Across hand at thumb web space 20.3 20.5 21.6 21.6  At base of 2nd digit 7.3 7.5 7.6 7.3  (Blank rows = not tested) Length 8cm Volume Rt: Volume Lt:        BREAST COMPLAINTS QUESTIONNAIRE (Eval and 07/17/22) Pain:   6  6  7  Heaviness:  5  5  7  Swollen feeling: 5  4  2  Tense Skin:  6  5  7  Redness:  5  0  0 Bra Print: 6  5  2  Size  of Pores:  2  0  1 Hard feeling:  8  5  10  Total:      43 /80  30/80  36/80 A Score over 9 indicates lymphedema issues in the breast  TODAY'S TREATMENT: Pt permission and consent throughout each step of examination and treatment with modification and draping if  requested when working on sensitive areas  09/11/22 Re-eval performed  Manual Therapy MLD by PT: In Supine: Short neck, 5 diaphragmatic breaths, Lt inguinal and Rt axillary nodes, Lt axillo-inguinal and anterior inter-axillary anastomosis then focused on Lt breast redirecting fluid towards neighboring anastomosis.   P/ROM: In Supine to Lt shoulder into flexion and abduction to pts available end motions STM to Lt axilla and scar tissue where pt reports most tenderness and scar tissue palpable.  07/17/22 Manual Therapy Performed MLD seated in front of the mirror with focus on vcs/tcs  MLD by PT: In Supine: Short neck, 5 diaphragmatic breaths, Lt inguinal and Rt axillary nodes, Lt axillo-inguinal and anterior inter-axillary anastomosis then focused on Lt breast redirecting fluid towards neighboring anastomosis. Then in sidelying posterior interaxillary work and axillo inguinal work.  P/ROM: In Supine to Lt shoulder into flexion and abduction to pts available end motions STM to Lt axilla and scar tissue where pt reports most tenderness and scar tissue palpable. Discussed POC with imaging today.   07/10/22 Manual Therapy Performed MLD seated in front of the mirror with focus on vcs/tcs  MLD by PT: In Supine: Short neck, 5 diaphragmatic breaths, Lt inguinal and Rt axillary nodes, Lt axillo-inguinal and anterior inter-axillary anastomosis then focused on Lt breast redirecting fluid towards neighboring anastomosis. P/ROM: In Supine to Lt shoulder into flexion and abduction to pts available end motions STM to Lt axilla and scar tissue where pt reports most tenderness and scar tissue palpable.  PATIENT EDUCATION:   Per today's note; self breast MLD  HOME EXERCISE PROGRAM:  06/12/22:Use compression and foam  Elevate arm if needed  06/19/22: Breast self MLD   Pt educated   Demonstration, tactile and verbal cues and handout issued   Pt returned demonstration, tactile and VC's and she will benefit from  further review  Has glove for hand     ASSESSMENT:  CLINICAL IMPRESSION: Pt has had increase in her breast edema questionnaire by 6 points and still has a resting pain level of 6/10 in the breast, Imaging showed fat necrosis which they will remove with surgery if PT does not help.  Will plan on 1-2x per week x 6 weeks to attempt to decrease pain.   Pt will benefit from skilled therapeutic intervention to improve on the following deficits: Decreased knowledge of precautions, impaired UE functional use, pain, decreased ROM, postural dysfunction.   PT treatment/interventions: ADL/self-care home management, pt/family education, therapeutic exercise  REHAB POTENTIAL: Excellent  CLINICAL DECISION MAKING: Stable/uncomplicated  EVALUATION COMPLEXITY: Low   GOALS: Goals reviewed with patient? YES  LONG TERM GOALS: (STG=LTG)    Name Target Date Goal status  1 Pt will return to ind with compression bra and foam use 07/17/22 MET  2 Pt will be ind with self MLD 07/17/22 MET  3 Pt will decrease breast pain at rest to 3/10 or less 10/30/22 NEW  4        PLAN: PT FREQUENCY/DURATION: 1-2x per week x 6 weeks up to 10/30/22  PLAN FOR NEXT SESSION:  *DO SOZO*, Cont and review Lt breast MLD and assess pts technique; cont scar tissue desensitization along with PROM  of Lt shoulder  Interventions: manual therapy, therapeutic exercise, self care, re-eval, continued SOZO, cupping, KT?     Idamae Lusher, PT 09/11/2022, 8:58 AM

## 2022-09-11 ENCOUNTER — Encounter: Payer: Self-pay | Admitting: Rehabilitation

## 2022-09-11 ENCOUNTER — Ambulatory Visit: Payer: Medicare PPO | Attending: Adult Health | Admitting: Rehabilitation

## 2022-09-11 DIAGNOSIS — N63 Unspecified lump in unspecified breast: Secondary | ICD-10-CM | POA: Diagnosis present

## 2022-09-11 DIAGNOSIS — R293 Abnormal posture: Secondary | ICD-10-CM | POA: Insufficient documentation

## 2022-09-11 DIAGNOSIS — I890 Lymphedema, not elsewhere classified: Secondary | ICD-10-CM | POA: Insufficient documentation

## 2022-09-11 DIAGNOSIS — Z483 Aftercare following surgery for neoplasm: Secondary | ICD-10-CM | POA: Insufficient documentation

## 2022-09-11 DIAGNOSIS — C50412 Malignant neoplasm of upper-outer quadrant of left female breast: Secondary | ICD-10-CM | POA: Diagnosis present

## 2022-09-18 ENCOUNTER — Ambulatory Visit: Payer: Medicare PPO

## 2022-09-18 ENCOUNTER — Ambulatory Visit: Payer: Medicare PPO | Attending: Adult Health | Admitting: Rehabilitation

## 2022-09-18 DIAGNOSIS — R293 Abnormal posture: Secondary | ICD-10-CM | POA: Insufficient documentation

## 2022-09-18 DIAGNOSIS — I890 Lymphedema, not elsewhere classified: Secondary | ICD-10-CM | POA: Diagnosis present

## 2022-09-18 DIAGNOSIS — Z483 Aftercare following surgery for neoplasm: Secondary | ICD-10-CM | POA: Diagnosis present

## 2022-09-18 DIAGNOSIS — C50412 Malignant neoplasm of upper-outer quadrant of left female breast: Secondary | ICD-10-CM | POA: Diagnosis present

## 2022-09-18 NOTE — Therapy (Signed)
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 29th, 2023   Pneumonia 2022   covid pneumonia   Sinus congestion    Past Surgical History:  Procedure Laterality Date   ABDOMINAL HYSTERECTOMY     BREAST LUMPECTOMY WITH RADIOACTIVE SEED AND SENTINEL LYMPH NODE BIOPSY Left 12/14/2021   Procedure: LEFT BREAST LUMPECTOMY WITH RADIOACTIVE SEED AND SENTINEL LYMPH NODE BIOPSY;  Surgeon: Griselda Miner, MD;  Location: [facility];  Service: General;  Laterality: Left;   CHOLECYSTECTOMY     DUPUYTREN CONTRACTURE RELEASE     GALLBLADDER SURGERY     LAPAROSCOPIC HYSTERECTOMY     left rotator cuff repair Left    Patient Active Problem List   Diagnosis Date Noted   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) 11/28/2021   Asthma 12/30/2020   Allergic rhinitis 12/30/2020   Seasonal allergic rhinitis due to pollen 08/07/2017   Sensorineural hearing loss (SNHL), bilateral 08/06/2017   Hypothyroidism 05/19/2015   Hypertension 05/19/2015   Hypercholesterolemia 05/19/2015   ABDOMINAL PAIN 04/01/2009   GERD 03/09/2009   IRRITABLE BOWEL SYNDROME 03/09/2009   DYSPHAGIA  UNSPECIFIED 03/09/2009    PCP: Rueben Bash PA-C  REFERRING PROVIDER: Lillard Anes, NP  REFERRING DIAG: Malignant neoplasm of upper-outer quadrant of left female breast, unspecified estrogen receptor status (CMS-HCC  THERAPY DIAG:  Abnormal posture  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   PERTINENT HISTORY:  Had left lumpectomy and SLNB on 12/14/21 due to [facility] of the left breast. 2 lymph nodes removed. Hx of left RCR.  Completed radiation. On anastrozole. Has osteoporosis. Wearing compression bra and using the pad.  Fat necrosis noted in the breast which has been better taking naproxen which makes it only a bit better.  Has a glove and a sleeve.    PATIENT GOALS   reduce lymphedema risk and learn post op HEP.   PAIN:  Are you having pain? 6/10 at rest  Increases with sweeping, cleaning, sleeping on the left side.   More of a burning feeling  PRECAUTIONS: Lymphedema risk Lt UE  HAND DOMINANCE: left  WEIGHT BEARING RESTRICTIONS No  FALLS:  Has patient fallen in last 6 months? No  LIVING ENVIRONMENT: Patient lives with: spouse  OCCUPATION: retired   LEISURE: none  PRIOR LEVEL OF FUNCTION: Independent   OBJECTIVE  COGNITION:  Overall cognitive status: Within functional limits for tasks assessed    POSTURE:  Forward head and rounded shoulders posture  OBSERVATION/PALPATION:   Wearing compression bra x 1 week.  The left breast has some tenderness near the lumpectomy incision but no fibrosis of lymphedema noted. It may be slightly  generally swollen as it is not smaller than expected due to radiation.   +2 ttp around incision with deep pressure  UPPER EXTREMITY AROM/PROM:   A/PROM LEFT   eval 01/04/22 06/12/22 09/11/22  Shoulder extension 60 65 60 60  Shoulder flexion 160 155 pull 155 pull 160 pull  Shoulder abduction 155 153 155 160 pull  Shoulder internal rotation        Shoulder external rotation 90 95 95                           (Blank rows = not tested)     LYMPHEDEMA ASSESSMENTS:    LANDMARK RIGHT   eval 06/12/22  15cm  35  10 cm proximal to olecranon process 32.5 34.3  Olecranon process 28 29.3  15cm  26.5  10 cm proximal to ulnar styloid process 23.3 23.3  Just proximal to ulnar styloid process 17.7 17.7  Across hand at thumb web space 20.4 20.4  At base of 2nd digit 6.8 6.9  (Blank rows = not tested)   LANDMARK LEFT   eval 01/04/22 06/12/22 09/11/22  15 cm   35   10 cm proximal to olecranon process 32.8 32.8 34 33.6  Olecranon process 28 28 29.2 29.4  15cm    27   10 cm proximal to ulnar styloid process 23.1 23.2 23.9 23.6  Just proximal to ulnar styloid process 17.5 17.6 17.7 17.9  Across hand at thumb web space 20.3 20.5 21.6 21.6  At base of 2nd digit 7.3 7.5 7.6 7.3  (Blank rows = not tested) Length 8cm Volume Rt: Volume Lt:       L-DEX FLOWSHEETS - 09/18/22 1000       L-DEX LYMPHEDEMA SCREENING   Measurement Type Unilateral    L-DEX MEASUREMENT EXTREMITY Upper Extremity    POSITION  Standing    DOMINANT SIDE Left    At Risk Side Left    BASELINE SCORE (UNILATERAL) -3.9    L-DEX SCORE (UNILATERAL) -4.8    VALUE CHANGE (UNILAT) -0.9             BREAST COMPLAINTS QUESTIONNAIRE (Eval and 07/17/22) Pain:  6  6  7  Heaviness:  5  5  7  Swollen feeling: 5  4  2  Tense Skin:  6  5  7  Redness:  5  0  0 Bra Print: 6  5  2  Size of Pores:  2  0  1 Hard feeling:  8  5  10  Total:      43 /80  30/80  36/80 A Score over 9 indicates lymphedema issues in the  breast  TODAY'S TREATMENT: Pt permission and consent throughout each step of examination and treatment with modification and draping if requested when working on sensitive areas 09/11/22 Manual Therapy MLD by PT: In Supine: Short neck, 5 diaphragmatic breaths, Lt inguinal and Rt axillary nodes, Lt axillo-inguinal and anterior inter-axillary anastomosis then focused on Lt breast redirecting fluid towards neighboring anastomosis.   P/ROM: In Supine to Lt shoulder into flexion and abduction to pts available end motions STM to Lt axilla and scar tissue where pt reports most tenderness and scar tissue palpable.  09/11/22 Re-eval performed  Manual Therapy MLD by PT: In Supine: Short neck, 5 diaphragmatic breaths, Lt inguinal and Rt axillary nodes, Lt axillo-inguinal and anterior inter-axillary anastomosis then focused on Lt breast redirecting fluid towards neighboring anastomosis.   P/ROM: In Supine to Lt shoulder into flexion and abduction to pts available end motions STM to Lt axilla and scar tissue where pt reports most tenderness and scar tissue palpable.  07/17/22 Manual Therapy Performed MLD seated in front of the mirror with focus on vcs/tcs  MLD by PT: In Supine: Short neck, 5 diaphragmatic breaths, Lt inguinal and Rt axillary nodes, Lt axillo-inguinal and anterior inter-axillary anastomosis then focused on Lt breast redirecting fluid towards neighboring anastomosis. Then in sidelying posterior interaxillary work and axillo inguinal work.  P/ROM: In Supine to Lt shoulder into flexion and abduction to pts available end motions STM to Lt axilla and scar tissue where pt reports most tenderness and scar tissue palpable. Discussed POC with imaging today.   07/10/22 Manual Therapy Performed MLD seated in front of the mirror with focus on vcs/tcs  MLD by PT: In Supine: Short neck, 5 diaphragmatic breaths, Lt inguinal and Rt axillary nodes, Lt axillo-inguinal and anterior inter-axillary anastomosis  then focused on Lt breast redirecting fluid towards neighboring anastomosis. P/ROM: In Supine to Lt shoulder into flexion and abduction to pts available end motions STM to Lt axilla and scar tissue where pt reports most tenderness and scar tissue palpable.  PATIENT EDUCATION:   Per today's note; self breast MLD  HOME EXERCISE PROGRAM:  06/12/22:Use compression and foam  Elevate arm if needed  06/19/22: Breast self MLD   Pt educated   Demonstration, tactile and verbal cues and handout issued   Pt returned demonstration, tactile and VC's and she will benefit from further review  Has glove for hand     ASSESSMENT:  CLINICAL IMPRESSION: Restarted breast MLD and STM today.  Scar tissue which is tender around +1 under incision but improved from last visits here in terms of size.    Pt will benefit from skilled therapeutic intervention to improve on the following deficits: Decreased knowledge of precautions, impaired UE functional use, pain, decreased ROM, postural dysfunction.   PT treatment/interventions: ADL/self-care home management, pt/family education, therapeutic exercise  REHAB POTENTIAL: Excellent  CLINICAL DECISION MAKING: Stable/uncomplicated  EVALUATION COMPLEXITY: Low   GOALS: Goals reviewed with patient? YES  LONG TERM GOALS: (STG=LTG)    Name Target Date Goal status  1 Pt will  return to ind with compression bra and foam use 07/17/22 MET  2 Pt will be ind with self MLD 07/17/22 MET  3 Pt will decrease breast pain at rest to 3/10 or less 10/30/22 NEW  4        PLAN: PT FREQUENCY/DURATION: 1-2x per week x 6 weeks up to 10/30/22  PLAN FOR NEXT SESSION:  Cont and review Lt breast MLD and assess pts technique; cont scar tissue desensitization along with PROM of Lt shoulder  Interventions: manual therapy, therapeutic exercise, self care, re-eval, continued SOZO, cupping, KT?     Idamae Lusher, PT 09/18/2022, 10:53 AM

## 2022-09-29 ENCOUNTER — Encounter: Payer: Self-pay | Admitting: Rehabilitation

## 2022-09-29 ENCOUNTER — Ambulatory Visit: Payer: Medicare PPO | Admitting: Rehabilitation

## 2022-09-29 DIAGNOSIS — I890 Lymphedema, not elsewhere classified: Secondary | ICD-10-CM

## 2022-09-29 DIAGNOSIS — Z483 Aftercare following surgery for neoplasm: Secondary | ICD-10-CM

## 2022-09-29 DIAGNOSIS — C50412 Malignant neoplasm of upper-outer quadrant of left female breast: Secondary | ICD-10-CM

## 2022-09-29 DIAGNOSIS — R293 Abnormal posture: Secondary | ICD-10-CM | POA: Diagnosis not present

## 2022-09-29 NOTE — Therapy (Signed)
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 29th, 2023   Pneumonia 2022   covid pneumonia   Sinus congestion    Past Surgical History:  Procedure Laterality Date   ABDOMINAL HYSTERECTOMY     BREAST LUMPECTOMY WITH RADIOACTIVE SEED AND SENTINEL LYMPH NODE BIOPSY Left 12/14/2021   Procedure: LEFT BREAST LUMPECTOMY WITH RADIOACTIVE SEED AND SENTINEL LYMPH NODE BIOPSY;  Surgeon: Griselda Miner, MD;  Location: [facility];  Service: General;  Laterality: Left;   CHOLECYSTECTOMY     DUPUYTREN CONTRACTURE RELEASE     GALLBLADDER SURGERY     LAPAROSCOPIC HYSTERECTOMY     left rotator cuff repair Left    Patient Active Problem List   Diagnosis Date Noted   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) 11/28/2021   Asthma 12/30/2020   Allergic rhinitis 12/30/2020   Seasonal allergic rhinitis due to pollen 08/07/2017   Sensorineural hearing loss (SNHL), bilateral 08/06/2017   Hypothyroidism 05/19/2015   Hypertension 05/19/2015   Hypercholesterolemia 05/19/2015   ABDOMINAL PAIN 04/01/2009   GERD 03/09/2009   IRRITABLE BOWEL SYNDROME 03/09/2009   DYSPHAGIA  UNSPECIFIED 03/09/2009    PCP: Rueben Bash PA-C  REFERRING PROVIDER: Lillard Anes, NP  REFERRING DIAG: Malignant neoplasm of upper-outer quadrant of left female breast, unspecified estrogen receptor status (CMS-HCC  THERAPY DIAG:  Abnormal posture  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  Its not hurting 24 hours per day anymore.  Just every now and then.    PERTINENT HISTORY:  Had left lumpectomy and SLNB on 12/14/21 due to [facility] of the left breast. 2 lymph nodes removed. Hx of left RCR.  Completed radiation. On anastrozole. Has osteoporosis. Wearing compression bra and using the pad.  Fat necrosis noted in the breast which has been better taking naproxen which makes it only a bit better.  Has a glove and a sleeve.    PATIENT GOALS   reduce lymphedema risk and learn post op HEP.   PAIN:  Are you having pain? 5/10 at rest  Increases with sweeping, cleaning, sleeping on the left side.   More of a burning feeling  PRECAUTIONS: Lymphedema risk Lt UE  HAND DOMINANCE: left  WEIGHT BEARING RESTRICTIONS No  FALLS:  Has patient fallen in last 6 months? No  LIVING ENVIRONMENT: Patient lives with: spouse  OCCUPATION: retired   LEISURE: none  PRIOR LEVEL OF FUNCTION: Independent   OBJECTIVE  COGNITION:  Overall cognitive status: Within functional limits for tasks assessed    POSTURE:  Forward head and rounded shoulders posture  OBSERVATION/PALPATION:   Wearing compression bra x 1  week.  The left breast has some tenderness near the lumpectomy incision but no fibrosis of lymphedema noted. It may be slightly generally swollen as it is not smaller than expected due to radiation.   +2 ttp around incision with deep pressure  UPPER EXTREMITY AROM/PROM:   A/PROM LEFT   eval 01/04/22 06/12/22 09/11/22  Shoulder extension 60 65 60 60  Shoulder flexion 160 155 pull 155 pull 160 pull  Shoulder abduction 155 153 155 160 pull  Shoulder internal rotation        Shoulder external rotation 90 95 95                           (Blank rows = not tested)     LYMPHEDEMA ASSESSMENTS:    LANDMARK RIGHT   eval 06/12/22  15cm  35  10 cm proximal to olecranon process 32.5 34.3  Olecranon process 28 29.3  15cm  26.5  10 cm proximal to ulnar styloid process 23.3 23.3  Just proximal to ulnar styloid process 17.7 17.7  Across hand at thumb web space 20.4 20.4  At base of 2nd digit 6.8 6.9  (Blank rows = not tested)   LANDMARK LEFT   eval 01/04/22 06/12/22 09/11/22  15 cm   35   10 cm proximal to olecranon process 32.8 32.8 34 33.6  Olecranon process 28 28 29.2 29.4  15cm    27   10 cm proximal to ulnar styloid process 23.1 23.2 23.9 23.6  Just proximal to ulnar styloid process 17.5 17.6 17.7 17.9  Across hand at thumb web space 20.3 20.5 21.6 21.6  At base of 2nd digit 7.3 7.5 7.6 7.3  (Blank rows = not tested) Length 8cm Volume Rt: Volume Lt:         BREAST COMPLAINTS QUESTIONNAIRE (Eval and 07/17/22) Pain:   6  6  7  Heaviness:  5  5  7  Swollen feeling: 5  4  2  Tense Skin:  6  5  7  Redness:  5  0  0 Bra Print: 6  5  2  Size of Pores:  2  0  1 Hard feeling:  8  5  10  Total:      43 /80  30/80  36/80 A Score over 9 indicates lymphedema issues in the breast  TODAY'S TREATMENT: Pt permission and consent throughout each step of examination and treatment with modification and draping if requested when working on sensitive areas  09/29/22 Manual Therapy MLD  by PT: In Supine: Short neck, 5 diaphragmatic breaths, Lt inguinal and Rt axillary nodes, Lt axillo-inguinal and anterior inter-axillary anastomosis then focused on Lt breast redirecting fluid towards neighboring anastomosis.   P/ROM: In Supine to Lt shoulder into flexion and abduction to pts available end motions STM to Lt axilla and scar tissue where pt reports most tenderness and scar tissue palpable. Exercise Pulleys into flexion and abduction x each Wall ball flexion x 5 with 5" holds   09/11/22 Manual Therapy MLD by PT: In Supine: Short neck, 5 diaphragmatic breaths, Lt inguinal and Rt axillary nodes, Lt axillo-inguinal and anterior inter-axillary anastomosis then focused on Lt breast redirecting fluid towards neighboring anastomosis.   P/ROM: In Supine to Lt shoulder into flexion and abduction to pts available end motions STM to Lt axilla and scar tissue where pt reports most tenderness and scar tissue palpable.  09/11/22 Re-eval performed  Manual Therapy MLD by PT: In Supine: Short neck, 5 diaphragmatic breaths, Lt inguinal and Rt axillary nodes, Lt axillo-inguinal and anterior inter-axillary anastomosis then focused on Lt breast redirecting fluid towards neighboring anastomosis.   P/ROM: In Supine to Lt shoulder into flexion and abduction to pts available end motions STM to Lt axilla and scar tissue where pt reports most tenderness and scar tissue palpable.  07/17/22 Manual Therapy Performed MLD seated in front of the mirror with focus on vcs/tcs  MLD by PT: In Supine: Short neck, 5 diaphragmatic breaths, Lt inguinal and Rt axillary nodes, Lt axillo-inguinal and anterior inter-axillary anastomosis then focused on Lt breast redirecting fluid towards neighboring anastomosis. Then in sidelying posterior interaxillary work and axillo inguinal work.  P/ROM: In Supine to Lt shoulder into flexion and abduction to pts available end motions STM to Lt axilla and scar tissue where pt reports  most tenderness and scar tissue palpable. Discussed POC with imaging today.   07/10/22 Manual Therapy Performed MLD seated in front of the mirror with focus on vcs/tcs  MLD by PT: In Supine: Short neck, 5 diaphragmatic breaths, Lt inguinal and Rt axillary nodes, Lt axillo-inguinal and anterior inter-axillary anastomosis then focused on Lt breast redirecting fluid towards neighboring anastomosis. P/ROM: In Supine to Lt shoulder into flexion and abduction to pts available end motions STM to Lt axilla and scar tissue where pt reports most tenderness and scar tissue palpable.  PATIENT EDUCATION:   Per today's note; self breast MLD  HOME EXERCISE PROGRAM:  06/12/22:Use compression and foam  Elevate arm if needed  06/19/22: Breast self MLD   Pt educated   Demonstration, tactile and verbal cues and handout issued   Pt returned demonstration, tactile and VC's and she will benefit from further review  Has glove for hand     ASSESSMENT:  CLINICAL IMPRESSION: Continued breast MLD and STM today.   Added some stretching with pulleys and the ball today.    Pt will benefit from skilled therapeutic intervention to improve on the following deficits: Decreased knowledge of precautions, impaired UE functional use, pain, decreased ROM, postural dysfunction.   PT treatment/interventions: ADL/self-care home management, pt/family education, therapeutic exercise  REHAB POTENTIAL: Excellent  CLINICAL  DECISION MAKING: Stable/uncomplicated  EVALUATION COMPLEXITY: Low   GOALS: Goals reviewed with patient? YES  LONG TERM GOALS: (STG=LTG)    Name Target Date Goal status  1 Pt will return to ind with compression bra and foam use 07/17/22 MET  2 Pt will be ind with self MLD 07/17/22 MET  3 Pt will decrease breast pain at rest to 3/10 or less 10/30/22 NEW  4        PLAN: PT FREQUENCY/DURATION: 1-2x per week x 6 weeks up to 10/30/22  PLAN FOR NEXT SESSION:  Cont and review Lt breast MLD and assess pts  technique; cont scar tissue desensitization along with PROM of Lt shoulder  Interventions: manual therapy, therapeutic exercise, self care, re-eval, continued SOZO, cupping, KT?     Idamae Lusher, PT 09/29/2022, 8:44 AM

## 2022-10-03 ENCOUNTER — Ambulatory Visit: Payer: Medicare PPO | Admitting: Rehabilitation

## 2022-10-03 DIAGNOSIS — R293 Abnormal posture: Secondary | ICD-10-CM | POA: Diagnosis not present

## 2022-10-03 DIAGNOSIS — C50412 Malignant neoplasm of upper-outer quadrant of left female breast: Secondary | ICD-10-CM

## 2022-10-03 DIAGNOSIS — I890 Lymphedema, not elsewhere classified: Secondary | ICD-10-CM

## 2022-10-03 DIAGNOSIS — Z483 Aftercare following surgery for neoplasm: Secondary | ICD-10-CM

## 2022-10-03 NOTE — Therapy (Signed)
OUTPATIENT PHYSICAL THERAPY BREAST CANCER TREATMENT   Patient Name: Sara House MRN: [id number] DOB:08-11-1955, 67 y.o., female Today's Date: 10/03/2022   PT End of Session - 10/03/22 0955     Visit Number 4    Number of Visits 7    Date for PT Re-Evaluation 10/30/22    Authorization Type 7 visits-1 re-eval 09/11/2022-10/30/2022    Authorization - Visit Number 4    Authorization - Number of Visits 7    PT Start Time 1000    PT Stop Time 1043    PT Time Calculation (min) 43 min    Activity Tolerance Patient tolerated treatment well    Behavior During Therapy 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 29th, 2023   Pneumonia 2022   covid pneumonia   Sinus congestion    Past Surgical History:  Procedure Laterality Date   ABDOMINAL HYSTERECTOMY     BREAST LUMPECTOMY WITH RADIOACTIVE SEED AND SENTINEL LYMPH NODE BIOPSY Left 12/14/2021   Procedure: LEFT BREAST LUMPECTOMY WITH RADIOACTIVE SEED AND SENTINEL LYMPH NODE BIOPSY;  Surgeon: Griselda Miner, MD;  Location: [facility];  Service: General;  Laterality: Left;   CHOLECYSTECTOMY     DUPUYTREN CONTRACTURE RELEASE     GALLBLADDER SURGERY     LAPAROSCOPIC HYSTERECTOMY     left rotator cuff repair Left    Patient Active Problem List   Diagnosis Date Noted   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) 11/28/2021   Asthma 12/30/2020   Allergic rhinitis 12/30/2020   Seasonal allergic rhinitis due to pollen 08/07/2017   Sensorineural hearing loss (SNHL), bilateral 08/06/2017   Hypothyroidism 05/19/2015   Hypertension 05/19/2015    Hypercholesterolemia 05/19/2015   ABDOMINAL PAIN 04/01/2009   GERD 03/09/2009   IRRITABLE BOWEL SYNDROME 03/09/2009   DYSPHAGIA UNSPECIFIED 03/09/2009    PCP: Rueben Bash PA-C  REFERRING PROVIDER: Lillard Anes, NP  REFERRING DIAG: Malignant neoplasm of upper-outer quadrant of left female breast, unspecified estrogen receptor status (CMS-HCC  THERAPY DIAG:  Abnormal posture  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  It started to hurt again more after the Naproxen.    PERTINENT HISTORY:  Had left lumpectomy and SLNB on 12/14/21 due to [facility] of the left breast. 2 lymph nodes removed. Hx of left RCR.  Completed radiation. On anastrozole. Has osteoporosis. Wearing compression bra and using the pad.  Fat necrosis noted in the breast which has been better taking naproxen which makes it only a bit better.  Has a glove and a sleeve.    PATIENT GOALS   reduce lymphedema risk and learn post op HEP.   PAIN:  Are you having pain? 6/10 at rest  Increases with sweeping, cleaning, sleeping on the left side.   More of a burning feeling that was gone for awhile   PRECAUTIONS: Lymphedema risk Lt UE  HAND DOMINANCE: left  WEIGHT BEARING RESTRICTIONS No  FALLS:  Has patient fallen in last 6 months? No  LIVING ENVIRONMENT: Patient lives with: spouse  OCCUPATION: retired   LEISURE: none  PRIOR LEVEL OF FUNCTION: Independent   OBJECTIVE  COGNITION:  Overall cognitive status: Within functional limits for tasks  assessed    POSTURE:  Forward head and rounded shoulders posture  OBSERVATION/PALPATION:   Wearing compression bra x 1 week.  The left breast has some tenderness near the lumpectomy incision but no fibrosis of lymphedema noted. It may be slightly generally swollen as it is not smaller than expected due to radiation.   +2 ttp around incision with deep pressure  UPPER EXTREMITY AROM/PROM:   A/PROM LEFT   eval 01/04/22 06/12/22 09/11/22  Shoulder extension 60 65 60 60  Shoulder flexion 160 155 pull 155 pull 160 pull  Shoulder abduction 155 153 155 160 pull  Shoulder internal rotation        Shoulder external rotation 90 95 95                           (Blank rows = not tested)     LYMPHEDEMA ASSESSMENTS:    LANDMARK RIGHT   eval 06/12/22  15cm  35  10 cm proximal to olecranon process 32.5 34.3  Olecranon process 28 29.3  15cm  26.5  10 cm proximal to ulnar styloid process 23.3 23.3  Just proximal to ulnar styloid process 17.7 17.7  Across hand at thumb web space 20.4 20.4  At base of 2nd digit 6.8 6.9  (Blank rows = not tested)   LANDMARK LEFT   eval 01/04/22 06/12/22 09/11/22  15 cm   35   10 cm proximal to olecranon process 32.8 32.8 34 33.6  Olecranon process 28 28 29.2 29.4  15cm    27   10 cm proximal to ulnar styloid process 23.1 23.2 23.9 23.6  Just proximal to ulnar styloid process 17.5 17.6 17.7 17.9  Across hand at thumb web space 20.3 20.5 21.6 21.6  At base of 2nd digit 7.3 7.5 7.6 7.3  (Blank rows = not tested) Length 8cm Volume Rt: Volume Lt:      BREAST COMPLAINTS QUESTIONNAIRE (Eval and 07/17/22) Pain:   6  6  7  Heaviness:  5  5  7  Swollen feeling: 5  4  2  Tense Skin:  6  5  7  Redness:  5  0  0 Bra Print: 6  5  2  Size of Pores:  2  0  1 Hard feeling:  8  5  10 Total:      43 /80  30/80  36/80 A Score over 9 indicates lymphedema issues in the breast  TODAY'S TREATMENT: Pt permission and consent throughout each step of examination  and treatment with modification and draping if requested when working on sensitive areas  10/03/22 Manual Therapy MLD by PT: In Supine: Short neck, 5 diaphragmatic breaths, Lt inguinal and Rt axillary nodes, Lt axillo-inguinal and anterior inter-axillary anastomosis then focused on Lt breast redirecting fluid towards neighboring anastomosis.   P/ROM: In Supine to Lt shoulder into flexion and abduction to pts available end motions STM to Lt axilla and scar tissue where pt reports most tenderness and scar tissue palpable. Exercise Pulleys into flexion and abduction x each Wall ball flexion x 5 with 5" holds   09/29/22 Manual Therapy MLD by PT: In Supine: Short neck, 5 diaphragmatic breaths, Lt inguinal and Rt axillary nodes, Lt axillo-inguinal and anterior inter-axillary anastomosis then focused on Lt breast redirecting fluid towards neighboring anastomosis.   P/ROM: In Supine to Lt shoulder into flexion and abduction to pts available end motions STM to Lt axilla and scar tissue where pt reports most tenderness and scar tissue palpable. Exercise Pulleys into flexion and abduction x each Wall ball flexion x 5 with 5" holds   09/11/22 Manual Therapy MLD by PT: In Supine: Short neck, 5 diaphragmatic breaths, Lt inguinal and Rt axillary nodes, Lt axillo-inguinal and anterior inter-axillary anastomosis then focused on Lt breast redirecting fluid towards neighboring anastomosis.   P/ROM: In Supine to Lt shoulder into flexion and abduction to pts available end motions STM to Lt axilla and scar tissue where pt reports most tenderness and scar tissue palpable.  PATIENT EDUCATION:   Per today's note; self breast MLD  HOME EXERCISE PROGRAM:  06/12/22:Use compression and foam  Elevate arm if needed  06/19/22: Breast self MLD   Pt educated   Demonstration, tactile and verbal cues and handout issued   Pt returned demonstration, tactile and VC's and she will benefit from further review  Has  glove for hand     ASSESSMENT:  CLINICAL IMPRESSION: Continued breast MLD and STM today.  Pt overall has minimal tightness in the shoulder and no real swelling in the breast but continues with pain in the fat necrosis / scar tissue area.  Still feels better and more relaxed after Rx.   Pt will benefit from skilled therapeutic intervention to improve on the following deficits: Decreased knowledge of precautions, impaired UE functional use, pain, decreased ROM, postural dysfunction.   PT treatment/interventions: ADL/self-care home management, pt/family education, therapeutic exercise  REHAB POTENTIAL: Excellent  CLINICAL DECISION MAKING: Stable/uncomplicated  EVALUATION COMPLEXITY: Low   GOALS: Goals reviewed with patient? YES  LONG TERM GOALS: (STG=LTG)    Name Target Date Goal status  1 Pt will return to ind with compression bra and foam use 07/17/22 MET  2 Pt will be ind with self MLD 07/17/22 MET  3 Pt will decrease breast pain at rest to 3/10 or less 10/30/22 NEW  4        PLAN: PT FREQUENCY/DURATION: 1-2x per week x 6 weeks up to 10/30/22  PLAN FOR NEXT SESSION:  Cont and review Lt breast MLD and assess pts technique; cont scar tissue desensitization along with PROM of Lt shoulder  Interventions: manual therapy, therapeutic exercise, self care, re-eval, continued SOZO, cupping, KT?     Idamae Lusher, PT 10/03/2022, 10:44 AM

## 2022-10-11 ENCOUNTER — Encounter: Payer: Self-pay | Admitting: Rehabilitation

## 2022-10-11 ENCOUNTER — Ambulatory Visit: Payer: Medicare PPO | Admitting: Rehabilitation

## 2022-10-11 DIAGNOSIS — R293 Abnormal posture: Secondary | ICD-10-CM | POA: Diagnosis not present

## 2022-10-11 DIAGNOSIS — I890 Lymphedema, not elsewhere classified: Secondary | ICD-10-CM

## 2022-10-11 DIAGNOSIS — Z483 Aftercare following surgery for neoplasm: Secondary | ICD-10-CM

## 2022-10-11 DIAGNOSIS — C50412 Malignant neoplasm of upper-outer quadrant of left female breast: Secondary | ICD-10-CM

## 2022-10-11 NOTE — Therapy (Signed)
OUTPATIENT PHYSICAL THERAPY BREAST CANCER TREATMENT   Patient Name: Sara House MRN: [id number] DOB:17-Jul-1955, 67 y.o., female Today's Date: 10/11/2022   PT End of Session - 10/11/22 0800     Visit Number 5    Number of Visits 7    Date for PT Re-Evaluation 10/30/22    Authorization Type 7 visits-1 re-eval 09/11/2022-10/30/2022    Authorization - Visit Number 5    Authorization - Number of Visits 7    PT Start Time 0800    PT Stop Time 0849    PT Time Calculation (min) 49 min    Activity Tolerance Patient tolerated treatment well    Behavior During Therapy [facility] for tasks assessed/performed                 Past Medical History:  Diagnosis Date   Allergies    Asthma    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 29th, 2023   Pneumonia 2022   covid pneumonia   Sinus congestion    Past Surgical History:  Procedure Laterality Date   ABDOMINAL HYSTERECTOMY     BREAST LUMPECTOMY WITH RADIOACTIVE SEED AND SENTINEL LYMPH NODE BIOPSY Left 12/14/2021   Procedure: LEFT BREAST LUMPECTOMY WITH RADIOACTIVE SEED AND SENTINEL LYMPH NODE BIOPSY;  Surgeon: Griselda Miner, MD;  Location: [facility];  Service: General;  Laterality: Left;   CHOLECYSTECTOMY     DUPUYTREN CONTRACTURE RELEASE     GALLBLADDER SURGERY     LAPAROSCOPIC HYSTERECTOMY     left rotator cuff repair Left    Patient Active Problem List   Diagnosis Date Noted   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) 11/28/2021   Asthma 12/30/2020   Allergic rhinitis 12/30/2020   Seasonal allergic rhinitis due to pollen 08/07/2017   Sensorineural hearing loss (SNHL), bilateral 08/06/2017   Hypothyroidism 05/19/2015   Hypertension 05/19/2015    Hypercholesterolemia 05/19/2015   ABDOMINAL PAIN 04/01/2009   GERD 03/09/2009   IRRITABLE BOWEL SYNDROME 03/09/2009   DYSPHAGIA UNSPECIFIED 03/09/2009    PCP: Rueben Bash PA-C  REFERRING PROVIDER: Lillard Anes, NP  REFERRING DIAG: Malignant neoplasm of upper-outer quadrant of left female breast, unspecified estrogen receptor status (CMS-HCC  THERAPY DIAG:  Abnormal posture  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  I'm still doing just the one aleve  PERTINENT HISTORY:  Had left lumpectomy and SLNB on 12/14/21 due to [facility] of the left breast. 2 lymph nodes removed. Hx of left RCR.  Completed radiation. On anastrozole. Has osteoporosis. Wearing compression bra and using the pad.  Fat necrosis noted in the breast which has been better taking naproxen which makes it only a bit better.  Has a glove and a sleeve.    PATIENT GOALS   reduce lymphedema risk and learn post op HEP.   PAIN:  Are you having pain? 5/10 at rest  Increases with sweeping, cleaning, sleeping on the left side.   More of a burning feeling that was gone for awhile   PRECAUTIONS: Lymphedema risk Lt UE  HAND DOMINANCE: left  WEIGHT BEARING RESTRICTIONS No  FALLS:  Has patient fallen in last 6 months? No  LIVING ENVIRONMENT: Patient lives with: spouse  OCCUPATION: retired   LEISURE: none  PRIOR LEVEL OF FUNCTION: Independent   OBJECTIVE  COGNITION:  Overall cognitive status: Within functional limits for tasks assessed    POSTURE:   Forward head and rounded shoulders posture  OBSERVATION/PALPATION:   Wearing compression bra x 1 week.  The left breast has some tenderness near the lumpectomy incision but no fibrosis of lymphedema noted. It may be slightly generally swollen as it is not smaller than expected due to radiation.   +2 ttp around incision with deep pressure  UPPER EXTREMITY AROM/PROM:   A/PROM LEFT   eval 01/04/22 06/12/22 09/11/22  Shoulder extension 60 65 60 60  Shoulder flexion 160 155 pull 155 pull 160 pull  Shoulder abduction 155 153 155 160 pull  Shoulder internal rotation        Shoulder external rotation 90 95 95                           (Blank rows = not tested)     LYMPHEDEMA ASSESSMENTS:    LANDMARK RIGHT   eval 06/12/22  15cm  35  10 cm proximal to olecranon process 32.5 34.3  Olecranon process 28 29.3  15cm  26.5  10 cm proximal to ulnar styloid process 23.3 23.3  Just proximal to ulnar styloid process 17.7 17.7  Across hand at thumb web space 20.4 20.4  At base of 2nd digit 6.8 6.9  (Blank rows = not tested)   LANDMARK LEFT   eval 01/04/22 06/12/22 09/11/22  15 cm   35   10 cm proximal to olecranon process 32.8 32.8 34 33.6  Olecranon process 28 28 29.2 29.4  15cm    27   10 cm proximal to ulnar styloid process 23.1 23.2 23.9 23.6  Just proximal to ulnar styloid process 17.5 17.6 17.7 17.9  Across hand at thumb web space 20.3 20.5 21.6 21.6  At base of 2nd digit 7.3 7.5 7.6 7.3  (Blank rows = not tested) Length 8cm Volume Rt: Volume Lt:      BREAST COMPLAINTS QUESTIONNAIRE (Eval and 07/17/22) Pain:   6  6  7  Heaviness:  5  5  7  Swollen feeling: 5  4  2  Tense Skin:  6  5  7  Redness:  5  0  0 Bra Print: 6  5  2  Size of Pores:  2  0  1 Hard feeling:  8  5  10 Total:      43 /80  30/80  36/80 A Score over 9 indicates lymphedema issues in the breast  TODAY'S TREATMENT: Pt permission and consent throughout each step of examination and treatment with  modification and draping if requested when working on sensitive areas  10/11/22 Manual Therapy MLD by PT: In Supine: Short neck, 5 diaphragmatic breaths, Lt inguinal and Rt axillary nodes, Lt axillo-inguinal and anterior inter-axillary anastomosis then focused on Lt breast redirecting fluid towards neighboring anastomosis.   P/ROM: In Supine to Lt shoulder into flexion and abduction to pts available end motions STM to Lt axilla and scar tissue where pt reports most tenderness and scar tissue palpable. Exercise Pulleys into flexion and abduction x each Wall ball flexion x 10 with 5" holds   10/03/22 Manual Therapy MLD by PT: In Supine: Short neck, 5 diaphragmatic breaths, Lt inguinal and Rt axillary nodes, Lt axillo-inguinal and anterior inter-axillary anastomosis then focused on Lt breast redirecting fluid towards neighboring anastomosis.   P/ROM: In Supine to Lt shoulder into flexion and abduction to pts available end motions STM to Lt axilla and scar tissue where pt reports most tenderness and scar tissue palpable. Exercise Pulleys into flexion and abduction x each Wall ball flexion x 5 with 5" holds   09/29/22 Manual Therapy MLD by PT: In Supine: Short neck, 5 diaphragmatic breaths, Lt inguinal and Rt axillary nodes, Lt axillo-inguinal and anterior inter-axillary anastomosis then focused on Lt breast redirecting fluid towards neighboring anastomosis.   P/ROM: In Supine to Lt shoulder into flexion and abduction to pts available end motions STM to Lt axilla and scar tissue where pt reports most tenderness and scar tissue palpable. Exercise Pulleys into flexion and abduction x each Wall ball flexion x 5 with 5" holds   09/11/22 Manual Therapy MLD by PT: In Supine: Short neck, 5 diaphragmatic breaths, Lt inguinal and Rt axillary nodes, Lt axillo-inguinal and anterior inter-axillary anastomosis then focused on Lt breast redirecting fluid towards neighboring anastomosis.    P/ROM: In Supine to Lt shoulder into flexion and abduction to pts available end motions STM to Lt axilla and scar tissue where pt reports most tenderness and scar tissue palpable.  PATIENT EDUCATION:   Per today's note; self breast MLD  HOME EXERCISE PROGRAM:  06/12/22:Use compression and foam  Elevate arm if needed  06/19/22: Breast self MLD   Pt educated   Demonstration, tactile and verbal cues and handout issued   Pt returned demonstration, tactile and VC's and she will benefit from further review  Has glove for hand     ASSESSMENT:  CLINICAL IMPRESSION: Continued breast MLD and STM today.  Pt overall has minimal tightness in the shoulder and no real swelling in the breast but continues with pain in the fat necrosis / scar tissue area.  Still feels better and more relaxed after Rx. Most liklely will have to decide if she wants to have surgery for necrosis removal or have it remain how it is.    Pt will benefit from skilled therapeutic intervention to improve on the following deficits: Decreased knowledge of precautions, impaired UE functional use, pain, decreased ROM, postural dysfunction.   PT treatment/interventions: ADL/self-care home management, pt/family education, therapeutic exercise  REHAB POTENTIAL: Excellent  CLINICAL DECISION MAKING: Stable/uncomplicated  EVALUATION COMPLEXITY: Low   GOALS: Goals reviewed with patient? YES  LONG TERM GOALS: (STG=LTG)    Name Target Date Goal status  1 Pt will return to ind with compression  bra and foam use 07/17/22 MET  2 Pt will be ind with self MLD 07/17/22 MET  3 Pt will decrease breast pain at rest to 3/10 or less 10/30/22 NEW  4        PLAN: PT FREQUENCY/DURATION: 1-2x per week x 6 weeks up to 10/30/22  PLAN FOR NEXT SESSION:  Cont and review Lt breast MLD and assess pts technique; cont scar tissue desensitization along with PROM of Lt shoulder  Interventions: manual therapy, therapeutic exercise, self care, re-eval,  continued SOZO, cupping, KT?     Idamae Lusher, PT 10/11/2022, 8:50 AM

## 2022-10-16 ENCOUNTER — Ambulatory Visit: Payer: Medicare PPO | Admitting: Rehabilitation

## 2022-10-16 ENCOUNTER — Encounter: Payer: Self-pay | Admitting: Rehabilitation

## 2022-10-16 DIAGNOSIS — Z483 Aftercare following surgery for neoplasm: Secondary | ICD-10-CM

## 2022-10-16 DIAGNOSIS — R293 Abnormal posture: Secondary | ICD-10-CM

## 2022-10-16 DIAGNOSIS — I890 Lymphedema, not elsewhere classified: Secondary | ICD-10-CM

## 2022-10-16 DIAGNOSIS — C50412 Malignant neoplasm of upper-outer quadrant of left female breast: Secondary | ICD-10-CM

## 2022-10-16 NOTE — Therapy (Signed)
OUTPATIENT PHYSICAL THERAPY BREAST CANCER TREATMENT   Patient Name: Sara House MRN: [id number] DOB:11/08/1955, 67 y.o., female Today's Date: 10/16/2022   PT End of Session - 10/16/22 0958     Visit Number 6    Number of Visits 7    Date for PT Re-Evaluation 10/30/22    Authorization Type 7 visits-1 re-eval 09/11/2022-10/30/2022    Authorization - Visit Number 6    Authorization - Number of Visits 7    PT Start Time 1000    PT Stop Time 1044    PT Time Calculation (min) 44 min    Activity Tolerance Patient tolerated treatment well    Behavior During Therapy [facility] for tasks assessed/performed                 Past Medical History:  Diagnosis Date   Allergies    Asthma    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 29th, 2023   Pneumonia 2022   covid pneumonia   Sinus congestion    Past Surgical History:  Procedure Laterality Date   ABDOMINAL HYSTERECTOMY     BREAST LUMPECTOMY WITH RADIOACTIVE SEED AND SENTINEL LYMPH NODE BIOPSY Left 12/14/2021   Procedure: LEFT BREAST LUMPECTOMY WITH RADIOACTIVE SEED AND SENTINEL LYMPH NODE BIOPSY;  Surgeon: Griselda Miner, MD;  Location: [facility];  Service: General;  Laterality: Left;   CHOLECYSTECTOMY     DUPUYTREN CONTRACTURE RELEASE     GALLBLADDER SURGERY     LAPAROSCOPIC HYSTERECTOMY     left rotator cuff repair Left    Patient Active Problem List   Diagnosis Date Noted   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) 11/28/2021   Asthma 12/30/2020   Allergic rhinitis 12/30/2020   Seasonal allergic rhinitis due to pollen 08/07/2017   Sensorineural hearing loss (SNHL), bilateral 08/06/2017   Hypothyroidism 05/19/2015   Hypertension 05/19/2015    Hypercholesterolemia 05/19/2015   ABDOMINAL PAIN 04/01/2009   GERD 03/09/2009   IRRITABLE BOWEL SYNDROME 03/09/2009   DYSPHAGIA UNSPECIFIED 03/09/2009    PCP: Rueben Bash PA-C  REFERRING PROVIDER: Lillard Anes, NP  REFERRING DIAG: Malignant neoplasm of upper-outer quadrant of left female breast, unspecified estrogen receptor status (CMS-HCC  THERAPY DIAG:  Abnormal posture  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    PERTINENT HISTORY:  Had left lumpectomy and SLNB on 12/14/21 due to [facility] of the left breast. 2 lymph nodes removed. Hx of left RCR.  Completed radiation. On anastrozole. Has osteoporosis. Wearing compression bra and using the pad.  Fat necrosis noted in the breast which has been better taking naproxen which makes it only a bit better.  Has a glove and a sleeve.    PATIENT GOALS   reduce lymphedema risk and learn post op HEP.   PAIN:  Are you having pain? 5/10 at rest  Increases with sweeping, cleaning, sleeping on the left side.   More of a burning feeling that was gone for awhile   PRECAUTIONS: Lymphedema risk Lt UE  HAND DOMINANCE: left  WEIGHT BEARING RESTRICTIONS No  FALLS:  Has patient fallen in last 6 months? No  LIVING ENVIRONMENT: Patient lives with: spouse  OCCUPATION: retired   LEISURE: none  PRIOR LEVEL OF FUNCTION: Independent   OBJECTIVE  COGNITION:  Overall cognitive status: Within functional limits for tasks assessed    POSTURE:  Forward head and  rounded shoulders posture  OBSERVATION/PALPATION:   Wearing compression bra x 1 week.  The left breast has some tenderness near the lumpectomy incision but no fibrosis of lymphedema noted. It may be slightly generally swollen as it is not smaller than expected due to radiation.   +2 ttp around incision with deep pressure  UPPER EXTREMITY AROM/PROM:   A/PROM LEFT   eval 01/04/22 06/12/22 09/11/22  Shoulder extension 60 65 60 60  Shoulder flexion 160 155 pull 155 pull 160 pull  Shoulder abduction 155 153 155 160 pull  Shoulder internal rotation        Shoulder external rotation 90 95 95                           (Blank rows = not tested)     LYMPHEDEMA ASSESSMENTS:    LANDMARK RIGHT   eval 06/12/22  15cm  35  10 cm proximal to olecranon process 32.5 34.3  Olecranon process 28 29.3  15cm  26.5  10 cm proximal to ulnar styloid process 23.3 23.3  Just proximal to ulnar styloid process 17.7 17.7  Across hand at thumb web space 20.4 20.4  At base of 2nd digit 6.8 6.9  (Blank rows = not tested)   LANDMARK LEFT   eval 01/04/22 06/12/22 09/11/22  15 cm   35   10 cm proximal to olecranon process 32.8 32.8 34 33.6  Olecranon process 28 28 29.2 29.4  15cm    27   10 cm proximal to ulnar styloid process 23.1 23.2 23.9 23.6  Just proximal to ulnar styloid process 17.5 17.6 17.7 17.9  Across hand at thumb web space 20.3 20.5 21.6 21.6  At base of 2nd digit 7.3 7.5 7.6 7.3  (Blank rows = not tested) Length 8cm Volume Rt: Volume Lt:      BREAST COMPLAINTS QUESTIONNAIRE (Eval and 07/17/22) Pain:   6  6  7  Heaviness:  5  5  7  Swollen feeling: 5  4  2  Tense Skin:  6  5  7  Redness:  5  0  0 Bra Print: 6  5  2  Size of Pores:  2  0  1 Hard feeling:  8  5  10  Total:  43 /80  30/80  36/80 A Score over 9 indicates lymphedema issues in the breast  TODAY'S TREATMENT: Pt permission and consent throughout each step of examination and treatment with modification and draping  if requested when working on sensitive areas  10/16/22 Manual Therapy MLD by PT: In Supine: Short neck, 5 diaphragmatic breaths, Lt inguinal and Rt axillary nodes, Lt axillo-inguinal and anterior inter-axillary anastomosis then focused on Lt breast redirecting fluid towards neighboring anastomosis.   P/ROM: In Supine to Lt shoulder into flexion and abduction to pts available end motions STM to Lt axilla and scar tissue where pt reports most tenderness and scar tissue palpable. Exercise Pulleys into flexion (easy) and abduction x each (easy)  Wall ball flexion x 10 with 5" holds   10/11/22 Manual Therapy MLD by PT: In Supine: Short neck, 5 diaphragmatic breaths, Lt inguinal and Rt axillary nodes, Lt axillo-inguinal and anterior inter-axillary anastomosis then focused on Lt breast redirecting fluid towards neighboring anastomosis.   P/ROM: In Supine to Lt shoulder into flexion and abduction to pts available end motions STM to Lt axilla and scar tissue where pt reports most tenderness and scar tissue palpable. Exercise Pulleys into flexion and abduction x each Wall ball flexion x 10 with 5" holds   10/03/22 Manual Therapy MLD by PT: In Supine: Short neck, 5 diaphragmatic breaths, Lt inguinal and Rt axillary nodes, Lt axillo-inguinal and anterior inter-axillary anastomosis then focused on Lt breast redirecting fluid towards neighboring anastomosis.   P/ROM: In Supine to Lt shoulder into flexion and abduction to pts available end motions STM to Lt axilla and scar tissue where pt reports most tenderness and scar tissue palpable. Exercise Pulleys into flexion and abduction x each Wall ball flexion x 5 with 5" holds    PATIENT EDUCATION:   Per today's note; self breast MLD  HOME EXERCISE PROGRAM:  06/12/22:Use compression and foam  Elevate arm if needed  06/19/22: Breast self MLD   Pt educated   Demonstration, tactile and verbal cues and handout issued   Pt returned  demonstration, tactile and VC's and she will benefit from further review  Has glove for hand     ASSESSMENT:  CLINICAL IMPRESSION: Continued breast MLD and STM today.  Pt overall has minimal tightness in the shoulder and no real swelling in the breast but continues with pain in the fat necrosis / scar tissue area.  Still feels better and more relaxed after Rx. Most liklely will have to decide if she wants to have surgery for necrosis removal or have it remain how it is.    Pt will benefit from skilled therapeutic intervention to improve on the following deficits: Decreased knowledge of precautions, impaired UE functional use, pain, decreased ROM, postural dysfunction.   PT treatment/interventions: ADL/self-care home management, pt/family education, therapeutic exercise  REHAB POTENTIAL: Excellent  CLINICAL DECISION MAKING: Stable/uncomplicated  EVALUATION COMPLEXITY: Low   GOALS: Goals reviewed with patient? YES  LONG TERM GOALS: (STG=LTG)    Name Target Date Goal status  1 Pt will return to ind with compression bra and foam use 07/17/22 MET  2 Pt will be ind with self MLD 07/17/22 MET  3 Pt will decrease breast pain at rest to 3/10 or less 10/30/22 NEW  4        PLAN: PT FREQUENCY/DURATION: 1-2x per week x 6 weeks up to 10/30/22  PLAN FOR NEXT SESSION:  Cont and review Lt breast MLD and assess pts technique; cont scar tissue  desensitization along with PROM of Lt shoulder  Interventions: manual therapy, therapeutic exercise, self care, re-eval, continued SOZO, cupping, KT?     Idamae Lusher, PT 10/16/2022, 10:46 AM

## 2022-10-23 ENCOUNTER — Ambulatory Visit: Payer: Medicare PPO | Attending: Adult Health | Admitting: Rehabilitation

## 2022-10-23 ENCOUNTER — Encounter: Payer: Self-pay | Admitting: Rehabilitation

## 2022-10-23 DIAGNOSIS — R293 Abnormal posture: Secondary | ICD-10-CM | POA: Insufficient documentation

## 2022-10-23 DIAGNOSIS — Z483 Aftercare following surgery for neoplasm: Secondary | ICD-10-CM | POA: Diagnosis not present

## 2022-10-23 DIAGNOSIS — C50412 Malignant neoplasm of upper-outer quadrant of left female breast: Secondary | ICD-10-CM | POA: Insufficient documentation

## 2022-10-23 DIAGNOSIS — I890 Lymphedema, not elsewhere classified: Secondary | ICD-10-CM | POA: Diagnosis not present

## 2022-10-23 NOTE — Therapy (Signed)
OUTPATIENT PHYSICAL THERAPY BREAST CANCER TREATMENT   Patient Name: Sara House MRN: [id number] DOB:09-Oct-1955, 67 y.o., female Today's Date: 10/23/2022   PT End of Session - 10/23/22 0757     Visit Number 7    Number of Visits 7    Date for PT Re-Evaluation 10/30/22    Authorization - Visit Number 7    Authorization - Number of Visits 7    PT Start Time 0800    PT Stop Time 0840    PT Time Calculation (min) 40 min    Activity Tolerance Patient tolerated treatment well    Behavior During Therapy [facility] for tasks assessed/performed                 Past Medical History:  Diagnosis Date   Allergies    Asthma    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 29th, 2023   Pneumonia 2022   covid pneumonia   Sinus congestion    Past Surgical History:  Procedure Laterality Date   ABDOMINAL HYSTERECTOMY     BREAST LUMPECTOMY WITH RADIOACTIVE SEED AND SENTINEL LYMPH NODE BIOPSY Left 12/14/2021   Procedure: LEFT BREAST LUMPECTOMY WITH RADIOACTIVE SEED AND SENTINEL LYMPH NODE BIOPSY;  Surgeon: Griselda Miner, MD;  Location: [facility];  Service: General;  Laterality: Left;   CHOLECYSTECTOMY     DUPUYTREN CONTRACTURE RELEASE     GALLBLADDER SURGERY     LAPAROSCOPIC HYSTERECTOMY     left rotator cuff repair Left    Patient Active Problem List   Diagnosis Date Noted   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) 11/28/2021   Asthma 12/30/2020   Allergic rhinitis 12/30/2020   Seasonal allergic rhinitis due to pollen 08/07/2017   Sensorineural hearing loss (SNHL), bilateral 08/06/2017   Hypothyroidism 05/19/2015   Hypertension 05/19/2015   Hypercholesterolemia 05/19/2015   ABDOMINAL PAIN 04/01/2009   GERD  03/09/2009   IRRITABLE BOWEL SYNDROME 03/09/2009   DYSPHAGIA UNSPECIFIED 03/09/2009    PCP: Rueben Bash PA-C  REFERRING PROVIDER: Lillard Anes, NP  REFERRING DIAG: Malignant neoplasm of upper-outer quadrant of left female breast, unspecified estrogen receptor status (CMS-HCC  THERAPY DIAG:  Abnormal posture  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   PERTINENT HISTORY:  Had left lumpectomy and SLNB on 12/14/21 due to [facility] of the left breast. 2 lymph nodes removed. Hx of left RCR.  Completed radiation. On anastrozole. Has osteoporosis. Wearing compression bra and using the pad.  Fat necrosis noted in the breast which has been better taking naproxen which makes it only a bit better.  Has a glove and a sleeve.    PATIENT GOALS   reduce lymphedema risk and learn post op HEP.   PAIN:  Are you having pain? 5/10 at rest  Increases with sweeping, cleaning, sleeping on the left side.   More of a burning feeling that was gone for awhile   PRECAUTIONS: Lymphedema risk Lt UE  HAND DOMINANCE: left  WEIGHT BEARING RESTRICTIONS No  FALLS:  Has patient fallen in last 6 months? No  LIVING ENVIRONMENT: Patient lives with: spouse  OCCUPATION: retired   LEISURE: none  PRIOR LEVEL OF FUNCTION: Independent   OBJECTIVE  COGNITION:  Overall cognitive status: Within functional limits for tasks assessed    POSTURE:  Forward head and rounded shoulders posture  OBSERVATION/PALPATION:   Wearing compression bra x 1 week.  The  left breast has some tenderness near the lumpectomy incision but no fibrosis of lymphedema noted. It may be slightly generally swollen as it is not smaller than expected due to radiation.   +2 ttp around incision with deep pressure  UPPER EXTREMITY AROM/PROM:   A/PROM LEFT   eval 01/04/22 06/12/22 09/11/22  Shoulder extension 60 65 60 60  Shoulder flexion 160 155 pull 155 pull 160 pull  Shoulder abduction 155 153 155 160 pull  Shoulder internal rotation        Shoulder external rotation 90 95 95                           (Blank rows = not tested)     LYMPHEDEMA ASSESSMENTS:    LANDMARK RIGHT   eval 06/12/22  15cm  35  10 cm proximal to olecranon process 32.5 34.3  Olecranon process 28 29.3  15cm  26.5  10 cm proximal to ulnar styloid process 23.3 23.3  Just proximal to ulnar styloid process 17.7 17.7  Across hand at thumb web space 20.4 20.4  At base of 2nd digit 6.8 6.9  (Blank rows = not tested)   LANDMARK LEFT   eval 01/04/22 06/12/22 09/11/22  15 cm   35   10 cm proximal to olecranon process 32.8 32.8 34 33.6  Olecranon process 28 28 29.2 29.4  15cm    27   10 cm proximal to ulnar styloid process 23.1 23.2 23.9 23.6  Just proximal to ulnar styloid process 17.5 17.6 17.7 17.9  Across hand at thumb web space 20.3 20.5 21.6 21.6  At base of 2nd digit 7.3 7.5 7.6 7.3  (Blank rows = not tested) Length 8cm Volume Rt: Volume Lt:      BREAST COMPLAINTS QUESTIONNAIRE (Eval and 07/17/22, and 10/23/22 on DC) Pain:   6  6  7  5     Heaviness:  5  5  7  2  Swollen feeling: 5  4  2   0 Tense Skin:  6  5  7  5  Redness:  5  0  0  0 Bra Print: 6  5  2  2  Size of Pores:  2  0  1  0 Hard feeling:  8  5  10   0 Total:      43 /80  30/80  36/80      14/80 A Score over 9 indicates lymphedema issues in the breast  TODAY'S TREATMENT: Pt permission and consent throughout each step of examination and treatment with modification and draping if requested when working on sensitive  areas  10/23/22 Manual Therapy MLD by PT: In Supine: Short neck, 5 diaphragmatic breaths, Lt inguinal and Rt axillary nodes, Lt axillo-inguinal and anterior inter-axillary anastomosis then focused on Lt breast redirecting fluid towards neighboring anastomosis.   P/ROM: In Supine to Lt shoulder into flexion and abduction to pts available end motions STM to Lt axilla and scar tissue where pt reports most tenderness and scar tissue palpable. Exercise Pulleys into flexion (easy) and abduction x each (easy)  Wall ball flexion x 10 with 5" holds   10/16/22 Manual Therapy MLD by PT: In Supine: Short neck, 5 diaphragmatic breaths, Lt inguinal and Rt axillary nodes, Lt axillo-inguinal and anterior inter-axillary anastomosis then focused on Lt breast redirecting fluid towards neighboring anastomosis.   P/ROM: In Supine to Lt shoulder into flexion and abduction to pts available end motions STM to Lt axilla and scar tissue where pt reports most tenderness and scar tissue palpable. Exercise Pulleys into flexion (easy) and abduction x each (easy)  Wall ball flexion x 10 with 5" holds   10/11/22 Manual Therapy MLD by PT: In Supine: Short neck, 5 diaphragmatic breaths, Lt inguinal and Rt axillary nodes, Lt axillo-inguinal and anterior inter-axillary anastomosis then focused on Lt breast redirecting fluid towards neighboring anastomosis.   P/ROM: In Supine to Lt shoulder into flexion and abduction to pts available end motions STM to Lt axilla and scar tissue where pt reports most tenderness and scar tissue palpable. Exercise Pulleys into flexion and abduction x each Wall ball flexion x 10 with 5" holds   10/03/22 Manual Therapy MLD by PT: In Supine: Short neck, 5 diaphragmatic breaths, Lt inguinal and Rt axillary nodes, Lt axillo-inguinal and anterior inter-axillary anastomosis then focused on Lt breast redirecting fluid towards neighboring anastomosis.   P/ROM: In Supine to Lt shoulder into  flexion and abduction to pts available end motions STM to Lt axilla and scar tissue where pt reports most tenderness and scar tissue palpable. Exercise Pulleys into flexion and abduction x each Wall ball flexion x 5 with 5" holds    PATIENT EDUCATION:   Per today's note; self breast MLD  HOME EXERCISE PROGRAM:  06/12/22:Use compression and foam  Elevate arm if needed  06/19/22: Breast self MLD   Pt educated   Demonstration, tactile and verbal cues and handout issued   Pt returned demonstration, tactile and VC's and she will benefit from further review  Has glove for hand     ASSESSMENT:  CLINICAL IMPRESSION: Continued breast MLD and STM today.  Pt overall has minimal tightness in the shoulder and no real swelling in the breast but continues with pain in the fat necrosis / scar tissue area.  She is ready for DC today and will have to decide if she wants to have surgery for necrosis removal or have it remain how it is.    Pt will benefit from skilled therapeutic intervention to improve on the following deficits: Decreased knowledge of precautions, impaired UE functional use, pain, decreased ROM, postural dysfunction.   PT treatment/interventions: ADL/self-care home management, pt/family education, therapeutic exercise  REHAB POTENTIAL:  Excellent  CLINICAL DECISION MAKING: Stable/uncomplicated  EVALUATION COMPLEXITY: Low   GOALS: Goals reviewed with patient? YES  LONG TERM GOALS: (STG=LTG)    Name Target Date Goal status  1 Pt will return to ind with compression bra and foam use 07/17/22 MET  2 Pt will be ind with self MLD 07/17/22 MET  3 Pt will decrease breast pain at rest to 3/10 or less 10/30/22 NOT MET  4        PLAN: PT FREQUENCY/DURATION: 1-2x per week x 6 weeks up to 10/30/22  PLAN FOR NEXT SESSION:  Cont and review Lt breast MLD and assess pts technique; cont scar tissue desensitization along with PROM of Lt shoulder  Interventions: manual therapy,  therapeutic exercise, self care, re-eval, continued SOZO, cupping, KT?     Idamae Lusher, PT 10/23/2022, 8:42 AM  PHYSICAL THERAPY DISCHARGE SUMMARY  Visits from Start of Care: 7  Current functional level related to goals / functional outcomes: See above   Remaining deficits: Continued breast pain   Education / Equipment: Final self care with compression, foam, stretches, and self MLD  Plan: Patient agrees to discharge.  Patient is being discharged due to meeting the stated rehab goals.     Will continue with SOZO

## 2022-11-06 ENCOUNTER — Ambulatory Visit
Admission: RE | Admit: 2022-11-06 | Discharge: 2022-11-06 | Disposition: A | Discharge status: Home / Self Care | Payer: Medicare PPO | Source: Ambulatory Visit | Attending: Adult Health | Admitting: Adult Health

## 2022-11-06 DIAGNOSIS — Z853 Personal history of malignant neoplasm of breast: Secondary | ICD-10-CM | POA: Diagnosis not present

## 2022-11-06 DIAGNOSIS — C50412 Malignant neoplasm of upper-outer quadrant of left female breast: Secondary | ICD-10-CM

## 2022-11-09 DIAGNOSIS — C50412 Malignant neoplasm of upper-outer quadrant of left female breast: Secondary | ICD-10-CM | POA: Diagnosis not present

## 2022-12-06 ENCOUNTER — Encounter: Payer: Self-pay | Admitting: Adult Health

## 2022-12-06 ENCOUNTER — Inpatient Hospital Stay: Payer: Medicare PPO | Attending: Adult Health | Admitting: Adult Health

## 2022-12-06 VITALS — BP 128/93 | HR 57 | Temp 98.2°F | Resp 18 | Ht 63.5 in | Wt 186.2 lb

## 2022-12-06 DIAGNOSIS — Z8249 Family history of ischemic heart disease and other diseases of the circulatory system: Secondary | ICD-10-CM | POA: Insufficient documentation

## 2022-12-06 DIAGNOSIS — K219 Gastro-esophageal reflux disease without esophagitis: Secondary | ICD-10-CM | POA: Insufficient documentation

## 2022-12-06 DIAGNOSIS — Z823 Family history of stroke: Secondary | ICD-10-CM | POA: Insufficient documentation

## 2022-12-06 DIAGNOSIS — Z9049 Acquired absence of other specified parts of digestive tract: Secondary | ICD-10-CM | POA: Insufficient documentation

## 2022-12-06 DIAGNOSIS — C50412 Malignant neoplasm of upper-outer quadrant of left female breast: Secondary | ICD-10-CM | POA: Insufficient documentation

## 2022-12-06 DIAGNOSIS — J45909 Unspecified asthma, uncomplicated: Secondary | ICD-10-CM | POA: Diagnosis not present

## 2022-12-06 DIAGNOSIS — Z79899 Other long term (current) drug therapy: Secondary | ICD-10-CM | POA: Diagnosis not present

## 2022-12-06 DIAGNOSIS — Z803 Family history of malignant neoplasm of breast: Secondary | ICD-10-CM | POA: Insufficient documentation

## 2022-12-06 DIAGNOSIS — I10 Essential (primary) hypertension: Secondary | ICD-10-CM | POA: Insufficient documentation

## 2022-12-06 DIAGNOSIS — Z8049 Family history of malignant neoplasm of other genital organs: Secondary | ICD-10-CM | POA: Diagnosis not present

## 2022-12-06 DIAGNOSIS — Z90710 Acquired absence of both cervix and uterus: Secondary | ICD-10-CM | POA: Insufficient documentation

## 2022-12-06 DIAGNOSIS — Z79811 Long term (current) use of aromatase inhibitors: Secondary | ICD-10-CM | POA: Insufficient documentation

## 2022-12-06 DIAGNOSIS — Z170 Estrogen receptor positive status [ER+]: Secondary | ICD-10-CM | POA: Diagnosis not present

## 2022-12-06 MED ORDER — NAPROXEN 500 MG PO TABS: 500.0000 mg | ORAL_TABLET | Freq: Two times a day (BID) | ORAL | 1 refills | Status: DC

## 2022-12-06 NOTE — Patient Instructions (Signed)
Zoledronic Acid Injection (Bone Disorders) What is this medication? ZOLEDRONIC ACID (ZOE le dron ik AS id) prevents and treats osteoporosis. It may also be used to treat Paget's disease of the bone. It works by Interior and spatial designer stronger and less likely to break (fracture). It belongs to a group of medications called bisphosphonates. This medicine may be used for other purposes; ask your health care provider or pharmacist if you have questions. COMMON BRAND NAME(S): Reclast What should I tell my care team before I take this medication? They need to know if you have any of these conditions: Bleeding disorder Cancer Dental disease Kidney disease Low levels of calcium in the blood Low red blood cell counts Lung or breathing disease, such as asthma Receiving steroids, such as dexamethasone or prednisone An unusual or allergic reaction to zoledronic acid, other medications, foods, dyes, or preservatives Pregnant or trying to get pregnant Breast-feeding How should I use this medication? This medication is injected into a vein. It is given by your care team in a hospital or clinic setting. A special MedGuide will be given to you before each treatment. Be sure to read this information carefully each time. Talk to your care team about the use of this medication in children. Special care may be needed. Overdosage: If you think you have taken too much of this medicine contact a poison control center or emergency room at once. NOTE: This medicine is only for you. Do not share this medicine with others. What if I miss a dose? Keep appointments for follow-up doses. It is important not to miss your dose. Call your care team if you are unable to keep an appointment. What may interact with this medication? Certain antibiotics given by injection Medications for pain and inflammation, such as ibuprofen, naproxen, NSAIDs Some diuretics, such as bumetanide, furosemide Teriparatide This list may not  describe all possible interactions. Give your health care provider a list of all the medicines, herbs, non-prescription drugs, or dietary supplements you use. Also tell them if you smoke, drink alcohol, or use illegal drugs. Some items may interact with your medicine. What should I watch for while using this medication? Visit your care team for regular checks on your progress. It may be some time before you see the benefit from this medication. Some people who take this medication have severe bone, joint, or muscle pain. This medication may also increase your risk for jaw problems or a broken thigh bone. Tell your care team right away if you have severe pain in your jaw, bones, joints, or muscles. Tell your care team if you have any pain that does not go away or that gets worse. You should make sure you get enough calcium and vitamin D while you are taking this medication. Discuss the foods you eat and the vitamins you take with your care team. You may need bloodwork while taking this medication. Tell your dentist and dental surgeon that you are taking this medication. You should not have major dental surgery while on this medication. See your dentist to have a dental exam and fix any dental problems before starting this medication. Take good care of your teeth while on this medication. Make sure you see your dentist for regular follow-up appointments. What side effects may I notice from receiving this medication? Side effects that you should report to your care team as soon as possible: Allergic reactions--skin rash, itching, hives, swelling of the face, lips, tongue, or throat Kidney injury--decrease in the amount of urine,  swelling of the ankles, hands, or feet Low calcium level--muscle pain or cramps, confusion, tingling, or numbness in the hands or feet Osteonecrosis of the jaw--pain, swelling, or redness in the mouth, numbness of the jaw, poor healing after dental work, unusual discharge from the  mouth, visible bones in the mouth Severe bone, joint, or muscle pain Side effects that usually do not require medical attention (report to your care team if they continue or are bothersome): Diarrhea Dizziness Headache Nausea Stomach pain Vomiting This list may not describe all possible side effects. Call your doctor for medical advice about side effects. You may report side effects to FDA at 1-800-FDA-1088. Where should I keep my medication? This medication is given in a hospital or clinic. It will not be stored at home. NOTE: This sheet is a summary. It may not cover all possible information. If you have questions about this medicine, talk to your doctor, pharmacist, or health care provider.  2024 Elsevier/Gold Standard (2021-04-22 00:00:00) Zoledronic Acid Injection (Cancer) What is this medication? ZOLEDRONIC ACID (ZOE le dron ik AS id) treats high calcium levels in the blood caused by cancer. It may also be used with chemotherapy to treat weakened bones caused by cancer. It works by slowing down the release of calcium from bones. This lowers calcium levels in your blood. It also makes your bones stronger and less likely to break (fracture). It belongs to a group of medications called bisphosphonates. This medicine may be used for other purposes; ask your health care provider or pharmacist if you have questions. COMMON BRAND NAME(S): Zometa, Zometa Powder What should I tell my care team before I take this medication? They need to know if you have any of these conditions: Dehydration Dental disease Kidney disease Liver disease Low levels of calcium in the blood Lung or breathing disease, such as asthma Receiving steroids, such as dexamethasone or prednisone An unusual or allergic reaction to zoledronic acid, other medications, foods, dyes, or preservatives Pregnant or trying to get pregnant Breast-feeding How should I use this medication? This medication is injected into a vein.  It is given by your care team in a hospital or clinic setting. Talk to your care team about the use of this medication in children. Special care may be needed. Overdosage: If you think you have taken too much of this medicine contact a poison control center or emergency room at once. NOTE: This medicine is only for you. Do not share this medicine with others. What if I miss a dose? Keep appointments for follow-up doses. It is important not to miss your dose. Call your care team if you are unable to keep an appointment. What may interact with this medication? Certain antibiotics given by injection Diuretics, such as bumetanide, furosemide NSAIDs, medications for pain and inflammation, such as ibuprofen or naproxen Teriparatide Thalidomide This list may not describe all possible interactions. Give your health care provider a list of all the medicines, herbs, non-prescription drugs, or dietary supplements you use. Also tell them if you smoke, drink alcohol, or use illegal drugs. Some items may interact with your medicine. What should I watch for while using this medication? Visit your care team for regular checks on your progress. It may be some time before you see the benefit from this medication. Some people who take this medication have severe bone, joint, or muscle pain. This medication may also increase your risk for jaw problems or a broken thigh bone. Tell your care team right away if you  have severe pain in your jaw, bones, joints, or muscles. Tell you care team if you have any pain that does not go away or that gets worse. Tell your dentist and dental surgeon that you are taking this medication. You should not have major dental surgery while on this medication. See your dentist to have a dental exam and fix any dental problems before starting this medication. Take good care of your teeth while on this medication. Make sure you see your dentist for regular follow-up appointments. You should  make sure you get enough calcium and vitamin D while you are taking this medication. Discuss the foods you eat and the vitamins you take with your care team. Check with your care team if you have severe diarrhea, nausea, and vomiting, or if you sweat a lot. The loss of too much body fluid may make it dangerous for you to take this medication. You may need bloodwork while taking this medication. Talk to your care team if you wish to become pregnant or think you might be pregnant. This medication can cause serious birth defects. What side effects may I notice from receiving this medication? Side effects that you should report to your care team as soon as possible: Allergic reactions--skin rash, itching, hives, swelling of the face, lips, tongue, or throat Kidney injury--decrease in the amount of urine, swelling of the ankles, hands, or feet Low calcium level--muscle pain or cramps, confusion, tingling, or numbness in the hands or feet Osteonecrosis of the jaw--pain, swelling, or redness in the mouth, numbness of the jaw, poor healing after dental work, unusual discharge from the mouth, visible bones in the mouth Severe bone, joint, or muscle pain Side effects that usually do not require medical attention (report to your care team if they continue or are bothersome): Constipation Fatigue Fever Loss of appetite Nausea Stomach pain This list may not describe all possible side effects. Call your doctor for medical advice about side effects. You may report side effects to FDA at 1-800-FDA-1088. Where should I keep my medication? This medication is given in a hospital or clinic. It will not be stored at home. NOTE: This sheet is a summary. It may not cover all possible information. If you have questions about this medicine, talk to your doctor, pharmacist, or health care provider.  2024 Elsevier/Gold Standard (2021-04-29 00:00:00)

## 2022-12-06 NOTE — Progress Notes (Signed)
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  No suspicious findings in the right breast.  Diagnostic mammogram of the left confirmed irregular mixed echogenicity mass with dense posterior caustic shadowing in the 2:00 location of the left breast 6 cm from the nipple measuring about 0.3 x 0.6 x 0.4 cm.  Left axilla is negative for adenopathy   11/22/2021 Pathology Results   Left breast needle core biopsy showed invasive ductal carcinoma, grade 1 along with focal DCIS, no evidence of lymphovascular invasion.  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)   11/28/2021 Cancer Staging   Staging form: Breast, AJCC 8th Edition - Clinical stage from 11/28/2021: Stage IA (cT1b, cN0, cM0, G1, ER+, PR+, HER2-) - Signed by Ronny Bacon, PA-C on 11/29/2021 Stage prefix: Initial diagnosis Method of lymph node assessment: Clinical Histologic grading system: 3 grade system   12/14/2021 Definitive Surgery   She is now status post left breast lumpectomy which showed invasive ductal carcinoma, grade 1, 1.2 cm in  greatest dimension, DCIS, solid and cribriform type, grade 2, calcifications associated with invasive carcinoma, negative for lymphovascular invasion.  1 lymph node negative for malignancy.  Left breast margin excision showed benign breast tissue with incidental small intraductal papilloma, usual ductal hyperplasia, fibrocystic change and apocrine metaplasia.  Negative for malignancy.  Left axillary lymph nodes negative for malignancy.  Previous prognostics showed ER 100% strong PR 85% strong HER2 -0, Ki-67 of 5%   12/14/2021 Oncotype testing   Oncotype DX of 10, distant recurrence risk at 9 years of 3% and group average absolute chemotherapy benefit in this age group less than 1%.   01/17/2022 - 02/15/2022 Radiation Therapy   Site Technique Total Dose (Gy) Dose per Fx (Gy) Completed Fx Beam Energies  Breast, Left: Breast_L 3D 42.56/42.56 2.66 16/16 10XFFF  Breast, Left: Breast_L_Bst             02/2022 -  Anti-estrogen oral therapy   Anastrozole      CURRENT THERAPY: Anastrozole  INTERVAL HISTORY: Sara House 67 y.o. female returns for f/u of her breast cancer.  She continues on Anastrozole daily.  She tolerates it well.  Her most recent mammogram occurred on November 06, 2022 demonstrating no mammographic evidence of malignancy, fat necrosis at the lumpectomy site in left upper outer quadrant, and breast density category B.  Repeat mammogram is recommended in August 2025.  The area of fat necrosis poses intermittent discomfort.  When the fat necrosis flare she takes naproxen p.o. twice daily.  She has tried Aleve over-the-counter however that does not help with her pain.    Her most recent  bone density testing occurred in 03/2022 and demonstrated osteoporosis with a T-score -2.6 on the left forearm.  She was prescribed Fosamax by her primary care provider however has been unable to tolerate it due to significant reflux and pain.      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  MEDICAL HISTORY: Past Medical History:  Diagnosis Date   Allergies    Asthma    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 29th, 2023   Pneumonia 2022   covid pneumonia   Sinus congestion     SURGICAL HISTORY: Past Surgical History:  Procedure Laterality Date   ABDOMINAL HYSTERECTOMY     BREAST LUMPECTOMY WITH RADIOACTIVE SEED AND SENTINEL LYMPH NODE BIOPSY Left 12/14/2021   Procedure: LEFT BREAST LUMPECTOMY WITH RADIOACTIVE SEED AND SENTINEL LYMPH NODE BIOPSY;  Surgeon: Griselda Miner, MD;  Location: [facility];  Service: General;  Laterality: Left;   CHOLECYSTECTOMY     DUPUYTREN CONTRACTURE RELEASE     GALLBLADDER SURGERY     LAPAROSCOPIC HYSTERECTOMY     left rotator cuff repair Left     SOCIAL HISTORY: Social History   Socioeconomic History   Marital status: Married    Spouse name: Not on file   Number of children: Not on file   Years of education: Not on file   Highest education level: Not on file  Occupational History   Not on file  Tobacco Use   Smoking status: Never   Smokeless tobacco: Never  Substance and Sexual Activity   Alcohol use: Never   Drug use: Never   Sexual activity: Yes     Birth control/protection: Surgical    Comment: hyst  Other Topics Concern   Not on file  Social History Narrative   Not on file   Social Determinants of Health   Financial Resource Strain: Not on file  Food Insecurity: Not on file  Transportation Needs: Not on file  Physical Activity: Not on file  Stress: Not on file  Social Connections: Not on file  Intimate Partner Violence: Not on file    FAMILY HISTORY: Family History  Problem Relation Age of Onset   CVA Mother    Stroke Mother    Heart failure Mother    Hypertension Mother    Uterine cancer Mother    CVA Father    Heart disease Father    Breast cancer Sister        unknown age    Review of Systems  Constitutional:  Negative for appetite change, chills, fatigue, fever and unexpected weight change.  HENT:   Negative for hearing loss, lump/mass and trouble swallowing.   Eyes:  Negative for eye problems and icterus.  Respiratory:  Negative  for chest tightness, cough and shortness of breath.   Cardiovascular:  Negative for chest pain, leg swelling and palpitations.  Gastrointestinal:  Negative for abdominal distention, abdominal pain, constipation, diarrhea, nausea and vomiting.  Endocrine: Negative for hot flashes.  Genitourinary:  Negative for difficulty urinating.   Musculoskeletal:  Negative for arthralgias.  Skin:  Negative for itching and rash.  Neurological:  Negative for dizziness, extremity weakness, headaches and numbness.  Hematological:  Negative for adenopathy. Does not bruise/bleed easily.  Psychiatric/Behavioral:  Negative for depression. The patient is not nervous/anxious.       PHYSICAL EXAMINATION    Vitals:   12/06/22 0949  BP: (!) 128/93  Pulse: (!) 57  Resp: 18  Temp: 98.2 F (36.8 C)  SpO2: 99%    Physical Exam Constitutional:      General: She is not in acute distress.    Appearance: Normal appearance. She is not toxic-appearing.  HENT:     Head: Normocephalic and atraumatic.      Mouth/Throat:     Mouth: Mucous membranes are moist.     Pharynx: Oropharynx is clear. No oropharyngeal exudate or posterior oropharyngeal erythema.  Eyes:     General: No scleral icterus. Cardiovascular:     Rate and Rhythm: Normal rate and regular rhythm.     Pulses: Normal pulses.     Heart sounds: Normal heart sounds.  Pulmonary:     Effort: Pulmonary effort is normal.     Breath sounds: Normal breath sounds.  Chest:     Comments: Left breast s/p lumpectomy and radiation, fat necrosis present and unchanged, no sign of local recurrence, right breast benign.  Abdominal:     General: Abdomen is flat. Bowel sounds are normal. There is no distension.     Palpations: Abdomen is soft.     Tenderness: There is no abdominal tenderness.  Musculoskeletal:        General: No swelling.     Cervical back: Neck supple.  Lymphadenopathy:     Cervical: No cervical adenopathy.  Skin:    General: Skin is warm and dry.     Findings: No rash.  Neurological:     General: No focal deficit present.     Mental Status: She is alert.  Psychiatric:        Mood and Affect: Mood normal.        Behavior: Behavior normal.     LABORATORY DATA:  CBC    Component Value Date/Time   WBC 6.4 10/21/2008 1000   RBC 4.83 10/21/2008 1000   HGB 14.0 10/21/2008 1000   HCT 42.6 10/21/2008 1000   PLT 201 10/21/2008 1000   MCV 88.3 10/21/2008 1000   MCHC 32.8 10/21/2008 1000   RDW 14.1 10/21/2008 1000   LYMPHSABS 2.3 10/21/2008 1000   MONOABS 0.5 10/21/2008 1000   EOSABS 0.1 10/21/2008 1000   BASOSABS 0.0 10/21/2008 1000    CMP     Component Value Date/Time   NA 140 10/21/2008 1000   K 4.4 10/21/2008 1000   CL 106 10/21/2008 1000   CO2 28 10/21/2008 1000   GLUCOSE 97 10/21/2008 1000   BUN 8 10/21/2008 1000   CREATININE 0.75 10/21/2008 1000   CALCIUM 9.7 10/21/2008 1000   GFRNONAA >60 10/21/2008 1000   GFRAA  10/21/2008 1000    >60        The eGFR has been calculated using the MDRD  equation. This calculation has not been validated in all clinical situations. eGFR's  persistently <60 mL/min signify possible Chronic Kidney Disease.     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  stage Ia left breast invasive ductal carcinoma: She will continue on anastrozole daily as she is tolerating this well.She has no clinical or radiopgrahic signs of breast cancer recurrence.  Continue with annual mammograms next due in 10/2023. left breast pain.  Secondary to fat necrosis.  I sent in Naproxen 500mg  PO BID. Will check renal function panel today.  Reviewed with her that NSAID use is not without risk and we discussed those risks and benefits including cardiac, gastric, and kidney issues.  She verbalized understanding and wishes to proceed with taking naproxen p.o. twice daily for several weeks and once stable she will reduce to once a day, and then take it as needed. Osteoporosis: She is unable to tolerate oral bisphosphonate therapy with Fosamax.  I reviewed with her the benefit of taken Zolendronate.  I gave her information about this and we discussed the risk of osteonecrosis of the jaw.  She is going to read about it and will follow-up with me in about 8 weeks for labs and to potentially receive Zolendronate. Health maintenance: I recommended she continue to follow-up with her primary care provider regularly.  We also discussed healthy diet and exercise.  RTC in 8 weeks for labs and follow-up.    All questions were answered. The patient knows to call the clinic with any problems, questions or concerns. We can certainly see the patient much sooner if necessary.  Total encounter time:30  minutes*in face-to-face visit time, chart review, lab review, care coordination, order entry, and documentation of the encounter time.    Lillard Anes, NP 12/11/22 10:28 AM Medical Oncology and Hematology [facility] [address] [place], [place] [address] Tel. [phone]    Fax. [phone]  *Total Encounter Time as defined by the Centers for Medicare and Medicaid Services includes, in addition to the face-to-face time of a patient visit (documented in the note above) non-face-to-face time: obtaining and reviewing outside history, ordering and reviewing medications, tests or procedures, care coordination (communications with other health care professionals or caregivers) and documentation in the medical record.

## 2022-12-08 DIAGNOSIS — I10 Essential (primary) hypertension: Secondary | ICD-10-CM | POA: Diagnosis not present

## 2022-12-08 DIAGNOSIS — R252 Cramp and spasm: Secondary | ICD-10-CM | POA: Diagnosis not present

## 2022-12-08 DIAGNOSIS — Z5181 Encounter for therapeutic drug level monitoring: Secondary | ICD-10-CM | POA: Diagnosis not present

## 2022-12-08 DIAGNOSIS — M810 Age-related osteoporosis without current pathological fracture: Secondary | ICD-10-CM | POA: Insufficient documentation

## 2022-12-08 DIAGNOSIS — E538 Deficiency of other specified B group vitamins: Secondary | ICD-10-CM | POA: Diagnosis not present

## 2022-12-08 DIAGNOSIS — E039 Hypothyroidism, unspecified: Secondary | ICD-10-CM | POA: Diagnosis not present

## 2022-12-08 DIAGNOSIS — J4530 Mild persistent asthma, uncomplicated: Secondary | ICD-10-CM | POA: Insufficient documentation

## 2022-12-08 DIAGNOSIS — E782 Mixed hyperlipidemia: Secondary | ICD-10-CM | POA: Insufficient documentation

## 2022-12-08 DIAGNOSIS — K219 Gastro-esophageal reflux disease without esophagitis: Secondary | ICD-10-CM | POA: Diagnosis not present

## 2022-12-08 DIAGNOSIS — J3089 Other allergic rhinitis: Secondary | ICD-10-CM | POA: Diagnosis not present

## 2022-12-11 NOTE — Assessment & Plan Note (Signed)
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  stage Ia left breast invasive ductal carcinoma: She will continue on anastrozole daily as she is tolerating this well.She has no clinical or radiopgrahic signs of breast cancer recurrence.  Continue with annual mammograms next due in 10/2023. left breast pain.  Secondary to fat necrosis.  I sent in Naproxen 500mg  PO BID. Will check renal function panel today.  Reviewed with her that NSAID use is not without risk and we discussed those risks and benefits including cardiac, gastric, and kidney issues.  She verbalized understanding and wishes to proceed with taking naproxen p.o. twice daily for several weeks and once stable she will reduce to once a day, and then take it as needed. Osteoporosis: She is unable to tolerate oral bisphosphonate therapy with Fosamax.  I reviewed with her the benefit of taken Zolendronate.  I gave her information about this and we discussed the risk of osteonecrosis of the jaw.  She is going to read about it and will follow-up with me in about 8 weeks for labs and to potentially receive Zolendronate. Health maintenance: I recommended she continue to follow-up with her primary care provider regularly.  We also discussed healthy diet and exercise.  RTC in 8 weeks for labs and follow-up.

## 2022-12-15 ENCOUNTER — Telehealth: Payer: Self-pay | Admitting: Adult Health

## 2022-12-15 NOTE — Telephone Encounter (Signed)
Invalid number, unable to reach patient and unable to leave message regarding changed appointment times/dates due to provider out of office

## 2022-12-25 ENCOUNTER — Ambulatory Visit: Payer: Medicare PPO | Attending: Adult Health

## 2022-12-25 DIAGNOSIS — Z483 Aftercare following surgery for neoplasm: Secondary | ICD-10-CM | POA: Insufficient documentation

## 2022-12-25 DIAGNOSIS — I890 Lymphedema, not elsewhere classified: Secondary | ICD-10-CM | POA: Insufficient documentation

## 2022-12-25 DIAGNOSIS — C50412 Malignant neoplasm of upper-outer quadrant of left female breast: Secondary | ICD-10-CM | POA: Insufficient documentation

## 2022-12-25 DIAGNOSIS — R293 Abnormal posture: Secondary | ICD-10-CM | POA: Insufficient documentation

## 2023-01-26 ENCOUNTER — Ambulatory Visit: Payer: Medicare PPO | Admitting: Adult Health

## 2023-01-26 ENCOUNTER — Other Ambulatory Visit: Payer: Medicare PPO

## 2023-01-29 ENCOUNTER — Ambulatory Visit: Payer: Medicare PPO | Attending: Adult Health

## 2023-01-29 VITALS — Wt 189.4 lb

## 2023-01-29 DIAGNOSIS — Z483 Aftercare following surgery for neoplasm: Secondary | ICD-10-CM | POA: Insufficient documentation

## 2023-01-29 NOTE — Therapy (Signed)
OUTPATIENT PHYSICAL THERAPY SOZO SCREENING NOTE   Patient Name: Sara House MRN: [id number] DOB:1955/10/28, 67 y.o., female Today's Date: 01/29/2023  PCP: Roger Kill, PA-C REFERRING PROVIDER: Lillard Anes Cornett*   PT End of Session - 01/29/23 [phone]     Visit Number 7   # unchanged due to screen only   PT Start Time 0809    PT Stop Time 0813    PT Time Calculation (min) 4 min    Activity Tolerance Patient tolerated treatment well    Behavior During Therapy [facility] for tasks assessed/performed             Past Medical History:  Diagnosis Date   Allergies    Asthma    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 29th, 2023   Pneumonia 2022   covid pneumonia   Sinus congestion    Past Surgical History:  Procedure Laterality Date   ABDOMINAL HYSTERECTOMY     BREAST LUMPECTOMY WITH RADIOACTIVE SEED AND SENTINEL LYMPH NODE BIOPSY Left 12/14/2021   Procedure: LEFT BREAST LUMPECTOMY WITH RADIOACTIVE SEED AND SENTINEL LYMPH NODE BIOPSY;  Surgeon: Griselda Miner, MD;  Location: [facility];  Service: General;  Laterality: Left;   CHOLECYSTECTOMY     DUPUYTREN CONTRACTURE RELEASE     GALLBLADDER SURGERY     LAPAROSCOPIC HYSTERECTOMY     left rotator cuff repair Left    Patient Active Problem List   Diagnosis Date Noted   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) 11/28/2021   Asthma 12/30/2020   Allergic rhinitis 12/30/2020   Seasonal allergic rhinitis due to pollen 08/07/2017   Sensorineural hearing loss (SNHL), bilateral 08/06/2017   Hypothyroidism 05/19/2015   Hypertension 05/19/2015   Hypercholesterolemia 05/19/2015   ABDOMINAL PAIN 04/01/2009   GERD 03/09/2009   IRRITABLE BOWEL  SYNDROME 03/09/2009   DYSPHAGIA UNSPECIFIED 03/09/2009    REFERRING DIAG: left breast cancer at risk for lymphedema  THERAPY DIAG: Aftercare following surgery for neoplasm  PERTINENT HISTORY: Had left lumpectomy and SLNB on 12/14/21 due to [facility] of the left breast. 2 lymph nodes removed. Hx of left RCR.  Completed radiation. On anastrozole. Has osteoporosis. Wearing compression bra and using the pad.  Fat necrosis noted in the breast which has been better taking naproxen which makes it only a bit better.  Has a glove and a sleeve.   PRECAUTIONS: left UE Lymphedema risk, None  SUBJECTIVE: Pt returns for her 3 month L-Dex screen.   PAIN:  Are you having pain? No  SOZO SCREENING: Patient was assessed today using the SOZO machine to determine the lymphedema index score. This was compared to her baseline score. It was determined that she is within the recommended range when compared to her baseline and no further action is needed at this time. She will continue SOZO screenings. These are done every 3 months for 2 years post operatively followed by every 6 months for 2 years, and then annually.   L-DEX FLOWSHEETS - 01/29/23 0800       L-DEX LYMPHEDEMA SCREENING   Measurement Type Unilateral    L-DEX MEASUREMENT EXTREMITY Upper Extremity    POSITION  Standing    DOMINANT SIDE Left  At Risk Side Left    BASELINE SCORE (UNILATERAL) -3.9    L-DEX SCORE (UNILATERAL) -6.4    VALUE CHANGE (UNILAT) -2.5               Hermenia Bers, PTA 01/29/2023, 8:12 AM

## 2023-01-30 ENCOUNTER — Other Ambulatory Visit: Payer: Medicare PPO

## 2023-01-30 ENCOUNTER — Ambulatory Visit: Payer: Medicare PPO | Admitting: Adult Health

## 2023-01-30 DIAGNOSIS — H40013 Open angle with borderline findings, low risk, bilateral: Secondary | ICD-10-CM | POA: Diagnosis not present

## 2023-02-06 ENCOUNTER — Other Ambulatory Visit: Payer: Self-pay

## 2023-02-06 DIAGNOSIS — C50412 Malignant neoplasm of upper-outer quadrant of left female breast: Secondary | ICD-10-CM

## 2023-02-07 ENCOUNTER — Inpatient Hospital Stay: Payer: Medicare PPO | Admitting: Adult Health

## 2023-02-07 ENCOUNTER — Inpatient Hospital Stay: Payer: Medicare PPO | Attending: Adult Health

## 2023-02-07 VITALS — BP 159/72 | HR 55 | Temp 97.3°F | Resp 18 | Wt 190.9 lb

## 2023-02-07 DIAGNOSIS — Z9049 Acquired absence of other specified parts of digestive tract: Secondary | ICD-10-CM | POA: Insufficient documentation

## 2023-02-07 DIAGNOSIS — Z79811 Long term (current) use of aromatase inhibitors: Secondary | ICD-10-CM | POA: Diagnosis not present

## 2023-02-07 DIAGNOSIS — J45909 Unspecified asthma, uncomplicated: Secondary | ICD-10-CM | POA: Insufficient documentation

## 2023-02-07 DIAGNOSIS — Z170 Estrogen receptor positive status [ER+]: Secondary | ICD-10-CM | POA: Diagnosis not present

## 2023-02-07 DIAGNOSIS — N6012 Diffuse cystic mastopathy of left breast: Secondary | ICD-10-CM | POA: Insufficient documentation

## 2023-02-07 DIAGNOSIS — Z1721 Progesterone receptor positive status: Secondary | ICD-10-CM | POA: Diagnosis not present

## 2023-02-07 DIAGNOSIS — Z90710 Acquired absence of both cervix and uterus: Secondary | ICD-10-CM | POA: Diagnosis not present

## 2023-02-07 DIAGNOSIS — Z8249 Family history of ischemic heart disease and other diseases of the circulatory system: Secondary | ICD-10-CM | POA: Diagnosis not present

## 2023-02-07 DIAGNOSIS — I10 Essential (primary) hypertension: Secondary | ICD-10-CM | POA: Insufficient documentation

## 2023-02-07 DIAGNOSIS — N6082 Other benign mammary dysplasias of left breast: Secondary | ICD-10-CM | POA: Insufficient documentation

## 2023-02-07 DIAGNOSIS — K219 Gastro-esophageal reflux disease without esophagitis: Secondary | ICD-10-CM | POA: Diagnosis not present

## 2023-02-07 DIAGNOSIS — C50412 Malignant neoplasm of upper-outer quadrant of left female breast: Secondary | ICD-10-CM | POA: Diagnosis not present

## 2023-02-07 DIAGNOSIS — Z823 Family history of stroke: Secondary | ICD-10-CM | POA: Insufficient documentation

## 2023-02-07 DIAGNOSIS — Z8049 Family history of malignant neoplasm of other genital organs: Secondary | ICD-10-CM | POA: Diagnosis not present

## 2023-02-07 DIAGNOSIS — Z79899 Other long term (current) drug therapy: Secondary | ICD-10-CM | POA: Diagnosis not present

## 2023-02-07 DIAGNOSIS — Z803 Family history of malignant neoplasm of breast: Secondary | ICD-10-CM | POA: Insufficient documentation

## 2023-02-07 LAB — CBC WITH DIFFERENTIAL (CANCER CENTER ONLY)
Abs Immature Granulocytes: 0.04 10*3/uL (ref 0.00–0.07)
Basophils Absolute: 0 10*3/uL (ref 0.0–0.1)
Basophils Relative: 1 %
Eosinophils Absolute: 0.2 10*3/uL (ref 0.0–0.5)
Eosinophils Relative: 3 %
HCT: 41.3 % (ref 36.0–46.0)
Hemoglobin: 13.3 g/dL (ref 12.0–15.0)
Immature Granulocytes: 1 %
Lymphocytes Relative: 25 %
Lymphs Abs: 1.9 10*3/uL (ref 0.7–4.0)
MCH: 29.5 pg (ref 26.0–34.0)
MCHC: 32.2 g/dL (ref 30.0–36.0)
MCV: 91.6 fL (ref 80.0–100.0)
Monocytes Absolute: 0.5 10*3/uL (ref 0.1–1.0)
Monocytes Relative: 7 %
Neutro Abs: 4.8 10*3/uL (ref 1.7–7.7)
Neutrophils Relative %: 63 %
Platelet Count: 179 10*3/uL (ref 150–400)
RBC: 4.51 MIL/uL (ref 3.87–5.11)
RDW: 13.6 % (ref 11.5–15.5)
WBC Count: 7.5 10*3/uL (ref 4.0–10.5)
nRBC: 0 % (ref 0.0–0.2)

## 2023-02-07 LAB — RENAL FUNCTION PANEL
Albumin: 3.7 g/dL (ref 3.5–5.0)
Anion gap: 7 (ref 5–15)
BUN: 18 mg/dL (ref 8–23)
CO2: 26 mmol/L (ref 22–32)
Calcium: 9.1 mg/dL (ref 8.9–10.3)
Chloride: 102 mmol/L (ref 98–111)
Creatinine, Ser: 0.78 mg/dL (ref 0.44–1.00)
GFR, Estimated: >60 mL/min (ref >60)
Glucose, Bld: 112 mg/dL — ABNORMAL HIGH (ref 70–99)
Phosphorus: 3.6 mg/dL (ref 2.5–4.6)
Potassium: 4.5 mmol/L (ref 3.5–5.1)
Sodium: 135 mmol/L (ref 135–145)

## 2023-02-07 LAB — CMP (CANCER CENTER ONLY)
ALT: 40 U/L (ref 0–44)
AST: 34 U/L (ref 15–41)
Albumin: 4 g/dL (ref 3.5–5.0)
Alkaline Phosphatase: 111 U/L (ref 38–126)
Anion gap: 5 (ref 5–15)
BUN: 17 mg/dL (ref 8–23)
CO2: 29 mmol/L (ref 22–32)
Calcium: 9.4 mg/dL (ref 8.9–10.3)
Chloride: 104 mmol/L (ref 98–111)
Creatinine: 0.78 mg/dL (ref 0.44–1.00)
GFR, Estimated: >60 mL/min (ref >60)
Glucose, Bld: 117 mg/dL — ABNORMAL HIGH (ref 70–99)
Potassium: 4.2 mmol/L (ref 3.5–5.1)
Sodium: 138 mmol/L (ref 135–145)
Total Bilirubin: 0.6 mg/dL (ref <1.2)
Total Protein: 7 g/dL (ref 6.5–8.1)

## 2023-02-07 MED ORDER — ANASTROZOLE 1 MG PO TABS: 1.0000 mg | ORAL_TABLET | Freq: Every day | ORAL | 3 refills | Status: DC

## 2023-02-07 NOTE — Patient Instructions (Signed)
Bone Health Bones protect organs, store calcium, anchor muscles, and support the whole body. Keeping your bones strong is important, especially as you get older. You can take actions to help keep your bones strong and healthy. Why is keeping my bones healthy important?  Keeping your bones healthy is important because your body constantly replaces bone cells. Cells get old, and new cells take their place. As we age, we lose bone cells because the body may not be able to make enough new cells to replace the old cells. The amount of bone cells and bone tissue you have is referred to as bone mass. The higher your bone mass, the stronger your bones. The aging process leads to an overall loss of bone mass in the body, which can increase the likelihood of: Broken bones. A condition in which the bones become weak and brittle (osteoporosis). A large decline in bone mass occurs in older adults. In women, it occurs about the time of menopause. What actions can I take to keep my bones healthy? Good health habits are important for maintaining healthy bones. This includes eating nutritious foods and exercising regularly. To have healthy bones, you need to get enough of the right minerals and vitamins. Most nutrition experts recommend getting these nutrients from the foods that you eat. In some cases, taking supplements may also be recommended. Doing certain types of exercise is also important for bone health. What are the nutritional recommendations for healthy bones?  Eating a well-balanced diet with plenty of calcium and vitamin D will help to protect your bones. Nutritional recommendations vary from person to person. Ask your health care provider what is healthy for you. Here are some general guidelines. Get enough calcium Calcium is the most important (essential) mineral for bone health. Most people can get enough calcium from their diet, but supplements may be recommended for people who are at risk for  osteoporosis. Good sources of calcium include: Dairy products, such as low-fat or nonfat milk, cheese, and yogurt. Dark green leafy vegetables, such as bok choy and broccoli. Foods that have calcium added to them (are fortified). Foods that may be fortified with calcium include orange juice, cereal, bread, soy beverages, and tofu products. Nuts, such as almonds. Follow these recommended amounts for daily calcium intake: Infants, 0-6 months: 200 mg. Infants, 6-12 months: 260 mg. Children, age 1-3: 700 mg. Children, age 4-8: 1,000 mg. Children, age 9-13: 1,300 mg. Teens, age 14-18: 1,300 mg. Adults, age 19-50: 1,000 mg. Adults, age 51-70: Men: 1,000 mg. Women: 1,200 mg. Adults, age 71 or older: 1,200 mg. Pregnant and breastfeeding females: Teens: 1,300 mg. Adults: 1,000 mg. Get enough vitamin D Vitamin D is the most essential vitamin for bone health. It helps the body absorb calcium. Sunlight stimulates the skin to make vitamin D, so be sure to get enough sunlight. If you live in a cold climate or you do not get outside often, your health care provider may recommend that you take vitamin D supplements. Good sources of vitamin D in your diet include: Egg yolks. Saltwater fish. Milk and cereal fortified with vitamin D. Follow these recommended amounts for daily vitamin D intake: Infants, 0-12 months: 400 international units (IU). Children and teens, age 1-18: 600 international units. Adults, age 59 or younger: 600 international units. Adults, age 60 or older: 600-1,000 international units. Get other important nutrients Other nutrients that are important for bone health include: Phosphorus. This mineral is found in meat, poultry, dairy foods, nuts, and legumes. The   recommended daily intake for adult men and adult women is 700 mg. Magnesium. This mineral is found in seeds, nuts, dark green vegetables, and legumes. The recommended daily intake for adult men is 400-420 mg. For adult women,  it is 310-320 mg. Vitamin K. This vitamin is found in green leafy vegetables. The recommended daily intake is 120 mcg for adult men and 90 mcg for adult women. What type of physical activity is best for building and maintaining healthy bones? Weight-bearing and strength-building activities are important for building and maintaining healthy bones. Weight-bearing activities cause muscles and bones to work against gravity. Strength-building activities increase the strength of the muscles that support bones. Weight-bearing and muscle-building activities include: Walking and hiking. Jogging and running. Dancing. Gym exercises. Lifting weights. Tennis and racquetball. Climbing stairs. Aerobics. Adults should get at least 30 minutes of moderate physical activity on most days. Children should get at least 60 minutes of moderate physical activity on most days. Ask your health care provider what type of exercise is best for you. How can I find out if my bone mass is low? Bone mass can be measured with an X-ray test called a bone mineral density (BMD) test. This test is recommended for all women who are age 65 or older. It may also be recommended for: Men who are age 70 or older. People who are at risk for osteoporosis because of: Having a long-term disease that weakens bones, such as kidney disease or rheumatoid arthritis. Having menopause earlier than normal. Taking medicine that weakens bones, such as steroids, thyroid hormones, or hormone treatment for breast cancer or prostate cancer. Smoking. Drinking three or more alcoholic drinks a day. Being underweight. Sedentary lifestyle. If you find that you have a low bone mass, you may be able to prevent osteoporosis or further bone loss by changing your diet and lifestyle. Where can I find more information? Bone Health & Osteoporosis Foundation: www.nof.org/patients National Institutes of Health: www.bones.nih.gov International Osteoporosis  Foundation: www.iofbonehealth.org Summary The aging process leads to an overall loss of bone mass in the body, which can increase the likelihood of broken bones and osteoporosis. Eating a well-balanced diet with plenty of calcium and vitamin D will help to protect your bones. Weight-bearing and strength-building activities are also important for building and maintaining strong bones. Bone mass can be measured with an X-ray test called a bone mineral density (BMD) test. This information is not intended to replace advice given to you by your health care provider. Make sure you discuss any questions you have with your health care provider. Document Revised: 08/18/2020 Document Reviewed: 08/18/2020 Elsevier Patient Education  2024 [employer] Inc.  

## 2023-02-07 NOTE — Assessment & Plan Note (Signed)
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  Breast Cancer On Anastrozole with no signs of recurrence. Mammogram on November 06, 2022, was negative. Breast pain likely due to fat necrosis from radiation therapy. -Continue Anastrozole. Refill prescription sent to [facility]. -Consider Aleve as needed for breast pain.  Osteoporosis T-score -2.6 in left forearm as of January 2024. Patient declined Prolia and Zometa. Currently on Calcium and Vitamin D3. -Encourage weight-bearing exercises, specifically walking 15-30 minutes daily. -Provide handout on weight-bearing exercises. -Continue Calcium and Vitamin D3.  Hypertension Managed by Chilton Greathouse at [facility]. -No changes recommended.  Weight Gain Patient reports significant weight gain and lack of formal exercise due to caregiving responsibilities. -Encourage intentional walking 15-30 minutes daily. -Continue healthy diet with fruits, vegetables, beans, and nuts. Limit intake of white bread and pasta.  Colon Cancer Screening Due for colonoscopy. -Encourage patient to schedule colonoscopy during husband's appointment.  RTC in 1 year for f/u.

## 2023-02-07 NOTE — Progress Notes (Addendum)
[facility] Cancer Follow up:    Sara Kill, PA-C [address] [place] [address]   DIAGNOSIS:  Cancer Staging  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 [facility]) Staging form: Breast, AJCC 8th Edition - Clinical stage from 11/28/2021: Stage IA (cT1b, cN0, cM0, G1, ER+, PR+, HER2-) - Signed by Ronny Bacon, PA-C on 11/29/2021 Stage prefix: Initial diagnosis Method of lymph node assessment: Clinical Histologic grading system: 3 grade system   SUMMARY OF ONCOLOGIC HISTORY: Oncology History  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)  11/03/2021 Mammogram   Screening mammogram showed possible asymmetry in the left breast warranting further evaluation.  No suspicious findings in the right breast.  Diagnostic mammogram of the left confirmed irregular mixed echogenicity mass with dense posterior caustic shadowing in the 2:00 location of the left breast 6 cm from the nipple measuring about 0.3 x 0.6 x 0.4 cm.  Left axilla is negative for adenopathy   11/22/2021 Pathology Results   Left breast needle core biopsy showed invasive ductal carcinoma, grade 1 along with focal DCIS, no evidence of lymphovascular invasion.  Prognostic showed ER +100% strong staining, PR 85% positive strong staining, Ki-67 of 5% and HER2 0 by [facility]   11/28/2021 Initial Diagnosis   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)   11/28/2021 Cancer Staging   Staging form: Breast, AJCC 8th Edition - Clinical stage from 11/28/2021: Stage IA (cT1b, cN0, cM0, G1, ER+, PR+, HER2-) - Signed by Ronny Bacon, PA-C on 11/29/2021 Stage prefix: Initial diagnosis Method of lymph node assessment: Clinical Histologic grading system: 3 grade system   12/14/2021 Definitive Surgery   She is now status post left breast lumpectomy which showed invasive ductal carcinoma, grade 1, 1.2 cm in  greatest dimension, DCIS, solid and cribriform type, grade 2, calcifications associated with invasive carcinoma, negative for lymphovascular invasion.  1 lymph node negative for malignancy.  Left breast margin excision showed benign breast tissue with incidental small intraductal papilloma, usual ductal hyperplasia, fibrocystic change and apocrine metaplasia.  Negative for malignancy.  Left axillary lymph nodes negative for malignancy.  Previous prognostics showed ER 100% strong PR 85% strong HER2 -0, Ki-67 of 5%   12/14/2021 Oncotype testing   Oncotype DX of 10, distant recurrence risk at 9 years of 3% and group average absolute chemotherapy benefit in this age group less than 1%.   01/17/2022 - 02/15/2022 Radiation Therapy   Site Technique Total Dose (Gy) Dose per Fx (Gy) Completed Fx Beam Energies  Breast, Left: Breast_L 3D 42.56/42.56 2.66 16/16 10XFFF  Breast, Left: Breast_L_Bst             02/2022 -  Anti-estrogen oral therapy   Anastrozole      CURRENT THERAPY: Anastrozole  INTERVAL HISTORY:  Discussed the use of AI scribe software for clinical note transcription with the patient, who gave verbal consent to proceed.  Sara House 67 y.o. female with a history of breast cancer, osteoporosis, and hypertension, presents with concerns about a persistent breast pain. She reports no changes in her breasts since her last mammogram in August 2024, which was negative. Mammogram and ultrasound in 06/2022 determined the pain was secondary to fat necrosis in the breast.  She is currently on anastrozole for breast cancer management and is due for a refill. She describes experiencing aches and pains, which she attributes to aging.  She denies hot flashes or vaginal dryness.  The patient also reports weight gain, particularly in the abdominal region, despite maintaining a diet rich in fruits and vegetables. She admits to a lack of formal exercise, attributing her physical activity to caregiving  responsibilities for her mother and nephew.  She has been experiencing elevated blood pressure, which is being managed by her primary care provider, Sara House. She also mentions a history of nerve damage after lumpectomy and is currently on gabapentin, which she has reduced to twice daily due to nightmares.  The patient also reports taking Naprosyn for breast pain management, but is aware of its potential adverse effects on her heart and kidneys. She is considering other long-term pain management strategies.   Patient Active Problem List   Diagnosis Date Noted   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) 11/28/2021   Asthma 12/30/2020   Allergic rhinitis 12/30/2020   Seasonal allergic rhinitis due to pollen 08/07/2017   Sensorineural hearing loss (SNHL), bilateral 08/06/2017   Hypothyroidism 05/19/2015   Hypertension 05/19/2015   Hypercholesterolemia 05/19/2015   ABDOMINAL PAIN 04/01/2009   GERD 03/09/2009   IRRITABLE BOWEL SYNDROME 03/09/2009   DYSPHAGIA UNSPECIFIED 03/09/2009    is allergic to alendronate, cefdinir, levofloxacin, penicillins, and doxycycline.  MEDICAL HISTORY: Past Medical History:  Diagnosis Date   Allergies    Asthma    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 29th, 2023   Pneumonia 2022   covid pneumonia   Sinus congestion     SURGICAL HISTORY: Past Surgical History:  Procedure Laterality Date   ABDOMINAL HYSTERECTOMY     BREAST LUMPECTOMY WITH RADIOACTIVE SEED AND SENTINEL LYMPH NODE BIOPSY Left 12/14/2021   Procedure: LEFT BREAST LUMPECTOMY WITH RADIOACTIVE SEED AND SENTINEL LYMPH NODE BIOPSY;  Surgeon: Griselda Miner, MD;  Location: [facility];  Service: General;  Laterality:  Left;   CHOLECYSTECTOMY     DUPUYTREN CONTRACTURE RELEASE     GALLBLADDER SURGERY     LAPAROSCOPIC HYSTERECTOMY     left rotator cuff repair Left     SOCIAL HISTORY: Social History   Socioeconomic History   Marital status: Married    Spouse name: Not on file   Number of children: Not on file   Years of education: Not on file   Highest education level: Not on file  Occupational History   Not on file  Tobacco Use   Smoking status: Never   Smokeless tobacco: Never  Substance and Sexual Activity   Alcohol use: Never   Drug use: Never   Sexual activity: Yes    Birth control/protection: Surgical    Comment: hyst  Other Topics Concern   Not on file  Social History Narrative   Not on file   Social Determinants of Health   Financial Resource Strain: Not on file  Food Insecurity: Low Risk  (12/08/2022)   Received from [facility]   Hunger Vital Sign    Worried About Running Out of Food in the Last Year: Never true    Ran Out of Food in the Last Year: Never true  Transportation Needs: No Transportation Needs (12/08/2022)   Received from [facility]  In the past 12 months, has lack of reliable transportation kept you from medical appointments, meetings, work or from getting things needed for daily living? : No  Physical Activity: Not on file  Stress: Not on file  Social Connections: Not on file  Intimate Partner Violence: Not on file    FAMILY HISTORY: Family History  Problem Relation Age of Onset   CVA Mother    Stroke Mother    Heart failure Mother    Hypertension Mother    Uterine cancer Mother    CVA Father    Heart disease Father    Breast cancer Sister        unknown age    Review of Systems  Constitutional:  Negative for appetite change, chills, fatigue, fever and unexpected weight change.  HENT:   Negative for hearing loss, lump/mass and trouble swallowing.   Eyes:  Negative for eye problems and icterus.  Respiratory:  Negative for  chest tightness, cough and shortness of breath.   Cardiovascular:  Negative for chest pain, leg swelling and palpitations.  Gastrointestinal:  Negative for abdominal distention, abdominal pain, constipation, diarrhea, nausea and vomiting.  Endocrine: Negative for hot flashes.  Genitourinary:  Negative for difficulty urinating.   Musculoskeletal:  Negative for arthralgias.  Skin:  Negative for itching and rash.  Neurological:  Negative for dizziness, extremity weakness, headaches and numbness.  Hematological:  Negative for adenopathy. Does not bruise/bleed easily.  Psychiatric/Behavioral:  Negative for depression. The patient is not nervous/anxious.       PHYSICAL EXAMINATION   Onc Performance Status - 02/07/23 0800       KPS SCALE   KPS % SCORE Able to carry on normal activity, minor s/s of disease             Vitals:   02/07/23 0837  BP: (!) 159/72  Pulse: (!) 55  Resp: 18  Temp: (!) 97.3 F (36.3 C)  SpO2: 99%    Physical Exam Constitutional:      General: She is not in acute distress.    Appearance: Normal appearance. She is not toxic-appearing.  HENT:     Head: Normocephalic and atraumatic.     Mouth/Throat:     Mouth: Mucous membranes are moist.     Pharynx: Oropharynx is clear. No oropharyngeal exudate or posterior oropharyngeal erythema.  Eyes:     General: No scleral icterus. Cardiovascular:     Rate and Rhythm: Normal rate and regular rhythm.     Pulses: Normal pulses.     Heart sounds: Normal heart sounds.  Pulmonary:     Effort: Pulmonary effort is normal.     Breath sounds: Normal breath sounds.  Chest:     Comments: Left bresat s.p lumpectomy and radiation, no sign of local recurrence, right breast benign Abdominal:     General: Abdomen is flat. Bowel sounds are normal. There is no distension.     Palpations: Abdomen is soft.     Tenderness: There is no abdominal tenderness.  Musculoskeletal:        General: No swelling.     Cervical back:  Neck supple.  Lymphadenopathy:     Cervical: No cervical adenopathy.     Upper Body:     Right upper body: No axillary adenopathy.     Left upper body: No axillary adenopathy.  Skin:    General: Skin is warm and dry.     Findings: No rash.  Neurological:     General: No focal  deficit present.     Mental Status: She is alert.  Psychiatric:        Mood and Affect: Mood normal.        Behavior: Behavior normal.     LABORATORY DATA:  CBC    Component Value Date/Time   WBC 7.5 02/07/2023 0818   WBC 6.4 10/21/2008 1000   RBC 4.51 02/07/2023 0818   HGB 13.3 02/07/2023 0818   HCT 41.3 02/07/2023 0818   PLT 179 02/07/2023 0818   MCV 91.6 02/07/2023 0818   MCH 29.5 02/07/2023 0818   MCHC 32.2 02/07/2023 0818   RDW 13.6 02/07/2023 0818   LYMPHSABS 1.9 02/07/2023 0818   MONOABS 0.5 02/07/2023 0818   EOSABS 0.2 02/07/2023 0818   BASOSABS 0.0 02/07/2023 0818    CMP     Component Value Date/Time   NA 138 02/07/2023 0818   K 4.2 02/07/2023 0818   CL 104 02/07/2023 0818   CO2 29 02/07/2023 0818   GLUCOSE 117 (H) 02/07/2023 0818   BUN 17 02/07/2023 0818   CREATININE 0.78 02/07/2023 0818   CALCIUM 9.4 02/07/2023 0818   PROT 7.0 02/07/2023 0818   ALBUMIN 4.0 02/07/2023 0818   AST 34 02/07/2023 0818   ALT 40 02/07/2023 0818   ALKPHOS 111 02/07/2023 0818   BILITOT 0.6 02/07/2023 0818   GFRNONAA >60 02/07/2023 0818   GFRAA  10/21/2008 1000    >60        The eGFR has been calculated using the MDRD equation. This calculation has not been validated in all clinical situations. eGFR's persistently <60 mL/min signify possible Chronic Kidney Disease.           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  Breast Cancer On Anastrozole with no signs of recurrence. Mammogram on November 06, 2022, was negative. Breast pain likely due to fat necrosis from radiation therapy. -Continue Anastrozole. Refill prescription sent to [facility]. -Consider Aleve as needed for breast pain.  Osteoporosis T-score -2.6 in left forearm as of January 2024. Patient declined Prolia and Zometa. Currently on Calcium and Vitamin D3. -Encourage weight-bearing exercises, specifically walking 15-30 minutes daily. -Provide handout on weight-bearing exercises. -Continue Calcium and Vitamin D3.  Hypertension Managed by Sara House at [facility]. -No changes recommended.  Weight Gain Patient reports significant weight gain and lack of formal exercise due to caregiving responsibilities. -Encourage intentional walking 15-30 minutes daily. -Continue healthy diet with fruits, vegetables, beans, and nuts. Limit intake of white bread and pasta.  Colon Cancer Screening Due for colonoscopy. -Encourage patient to schedule colonoscopy during husband's appointment.  RTC in 1 year for f/u.      All questions were answered. The patient knows to call the clinic with any problems, questions or concerns. We can certainly see the patient much sooner if necessary.  Total encounter time:30 minutes*in face-to-face visit time, chart review, lab review, care coordination, order entry, and documentation of the encounter time.    Lillard Anes, NP 02/07/23 9:26 AM Medical Oncology and Hematology [facility] [address] [place], [place] [address] Tel. [phone]    Fax. [phone]  *Total Encounter Time as defined by the Centers for Medicare and Medicaid Services includes, in addition to the  face-to-face time of a patient visit (documented in the note above) non-face-to-face time: obtaining and reviewing outside history, ordering and reviewing medications, tests or procedures,  care coordination (communications with other health care professionals or caregivers) and documentation in the medical record.

## 2023-03-22 DIAGNOSIS — E538 Deficiency of other specified B group vitamins: Secondary | ICD-10-CM | POA: Diagnosis not present

## 2023-03-22 DIAGNOSIS — E039 Hypothyroidism, unspecified: Secondary | ICD-10-CM | POA: Diagnosis not present

## 2023-03-22 DIAGNOSIS — M810 Age-related osteoporosis without current pathological fracture: Secondary | ICD-10-CM | POA: Diagnosis not present

## 2023-03-22 DIAGNOSIS — Z1159 Encounter for screening for other viral diseases: Secondary | ICD-10-CM | POA: Diagnosis not present

## 2023-03-22 DIAGNOSIS — Z0000 Encounter for general adult medical examination without abnormal findings: Secondary | ICD-10-CM | POA: Diagnosis not present

## 2023-03-22 DIAGNOSIS — J4530 Mild persistent asthma, uncomplicated: Secondary | ICD-10-CM | POA: Diagnosis not present

## 2023-03-22 DIAGNOSIS — H938X2 Other specified disorders of left ear: Secondary | ICD-10-CM | POA: Diagnosis not present

## 2023-03-22 DIAGNOSIS — J301 Allergic rhinitis due to pollen: Secondary | ICD-10-CM | POA: Diagnosis not present

## 2023-03-22 DIAGNOSIS — Z5181 Encounter for therapeutic drug level monitoring: Secondary | ICD-10-CM | POA: Diagnosis not present

## 2023-03-22 DIAGNOSIS — K219 Gastro-esophageal reflux disease without esophagitis: Secondary | ICD-10-CM | POA: Diagnosis not present

## 2023-03-22 DIAGNOSIS — Z1211 Encounter for screening for malignant neoplasm of colon: Secondary | ICD-10-CM | POA: Diagnosis not present

## 2023-03-22 DIAGNOSIS — E782 Mixed hyperlipidemia: Secondary | ICD-10-CM | POA: Diagnosis not present

## 2023-04-30 ENCOUNTER — Ambulatory Visit: Payer: Medicare PPO | Attending: Adult Health

## 2023-04-30 VITALS — Wt 179.0 lb

## 2023-04-30 DIAGNOSIS — Z483 Aftercare following surgery for neoplasm: Secondary | ICD-10-CM | POA: Insufficient documentation

## 2023-04-30 NOTE — Therapy (Signed)
 OUTPATIENT PHYSICAL THERAPY SOZO SCREENING NOTE   Patient Name: Sara House MRN: [id number] DOB:[birth year], 68 y.o., female Today's Date: 04/30/2023  PCP: Jenell Mirza, PA-C REFERRING PROVIDER: Alwin Baars [place]*   PT End of Session - 04/30/23 [phone]     Visit Number 7   # unchanged due to screen only   PT Start Time 0820    PT Stop Time 0824    PT Time Calculation (min) 4 min    Activity Tolerance Patient tolerated treatment well    Behavior During Therapy [facility] for tasks assessed/performed             Past Medical History:  Diagnosis Date   Allergies    Asthma    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 29th, 2023   Pneumonia 2022   covid pneumonia   Sinus congestion    Past Surgical History:  Procedure Laterality Date   ABDOMINAL HYSTERECTOMY     BREAST LUMPECTOMY WITH RADIOACTIVE SEED AND SENTINEL LYMPH NODE BIOPSY Left 12/14/2021   Procedure: LEFT BREAST LUMPECTOMY WITH RADIOACTIVE SEED AND SENTINEL LYMPH NODE BIOPSY;  Surgeon: Caralyn Chandler, MD;  Location: [facility];  Service: General;  Laterality: Left;   CHOLECYSTECTOMY     DUPUYTREN CONTRACTURE RELEASE     GALLBLADDER SURGERY     LAPAROSCOPIC HYSTERECTOMY     left rotator cuff repair Left    Patient Active Problem List   Diagnosis Date Noted   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) 11/28/2021   Asthma 12/30/2020   Allergic rhinitis 12/30/2020   Seasonal allergic rhinitis due to pollen 08/07/2017   Sensorineural hearing loss (SNHL), bilateral 08/06/2017   Hypothyroidism 05/19/2015   Hypertension 05/19/2015   Hypercholesterolemia 05/19/2015   ABDOMINAL PAIN 04/01/2009   GERD 03/09/2009   IRRITABLE BOWEL  SYNDROME 03/09/2009   DYSPHAGIA UNSPECIFIED 03/09/2009    REFERRING DIAG: left breast cancer at risk for lymphedema  THERAPY DIAG: Aftercare following surgery for neoplasm  PERTINENT HISTORY: Had left lumpectomy and SLNB on 12/14/21 due to [facility] of the left breast. 2 lymph nodes removed. Hx of left RCR.  Completed radiation. On anastrozole . Has osteoporosis. Wearing compression bra and using the pad.  Fat necrosis noted in the breast which has been better taking naproxen  which makes it only a bit better.  Has a glove and a sleeve.   PRECAUTIONS: left UE Lymphedema risk, None  SUBJECTIVE: Pt returns for her 3 month L-Dex screen.   PAIN:  Are you having pain? No  SOZO SCREENING: Patient was assessed today using the SOZO machine to determine the lymphedema index score. This was compared to her baseline score. It was determined that she is within the recommended range when compared to her baseline and no further action is needed at this time. She will continue SOZO screenings. These are done every 3 months for 2 years post operatively followed by every 6 months for 2 years, and then annually.   L-DEX FLOWSHEETS - 04/30/23 0800       L-DEX LYMPHEDEMA SCREENING   Measurement Type Unilateral    L-DEX MEASUREMENT EXTREMITY Upper Extremity    POSITION  Standing    DOMINANT SIDE Left  At Risk Side Left    BASELINE SCORE (UNILATERAL) -3.9    L-DEX SCORE (UNILATERAL) -5.6    VALUE CHANGE (UNILAT) -1.7             P: May transition to 6 month after next screen.   Denyce Flank, PTA 04/30/2023, 8:22 AM

## 2023-05-15 ENCOUNTER — Ambulatory Visit (AMBULATORY_SURGERY_CENTER): Payer: Medicare PPO

## 2023-05-15 VITALS — Ht 63.5 in | Wt 178.0 lb

## 2023-05-15 DIAGNOSIS — Z1211 Encounter for screening for malignant neoplasm of colon: Secondary | ICD-10-CM

## 2023-05-15 MED ORDER — NA SULFATE-K SULFATE-MG SULF 17.5-3.13-1.6 GM/177ML PO SOLN: 1.0000 | Freq: Once | ORAL | 0 refills | Status: AC

## 2023-05-15 NOTE — Progress Notes (Signed)
 No egg or soy allergy known to patient  No issues known to pt with past sedation with any surgeries or procedures Patient denies ever being told they had issues or difficulty with intubation  No FH of Malignant Hyperthermia Pt is not on diet pills Pt is not on  home 02  Pt is not on blood thinners  Pt has issues with constipation and takes prunes and apple juice with stool softners No A fib or A flutter Have any cardiac testing pending--no Pt can ambulate independently Pt denies use of chewing tobacco Discussed diabetic I weight loss medication holds Discussed NSAID holds Checked BMI Pt instructed to use Singlecare.com or GoodRx for a price reduction on prep  Patient's chart reviewed by Cathlyn Parsons CNRA prior to previsit and patient appropriate for the [facility].  Pre visit completed and red dot placed by patient's name on their procedure day (on provider's schedule).

## 2023-05-22 ENCOUNTER — Encounter: Payer: Medicare PPO | Admitting: Internal Medicine

## 2023-06-07 ENCOUNTER — Ambulatory Visit (AMBULATORY_SURGERY_CENTER): Payer: Medicare PPO

## 2023-06-07 VITALS — Ht 63.5 in | Wt 176.0 lb

## 2023-06-07 DIAGNOSIS — Z1211 Encounter for screening for malignant neoplasm of colon: Secondary | ICD-10-CM

## 2023-06-07 MED ORDER — SUFLAVE 178.7 G PO SOLR: 1.0000 | Freq: Once | ORAL | 0 refills | Status: AC

## 2023-06-07 NOTE — Progress Notes (Signed)
 No egg or soy allergy known to patient  No issues known to pt with past sedation with any surgeries or procedures Patient denies ever being told they had issues or difficulty with intubation  No FH of Malignant Hyperthermia Pt is not on diet pills Pt is not on  home 02  Pt is not on blood thinners  Pt has issues with constipation and takes stool softners, prunes apple and miralax  No A fib or A flutter Have any cardiac testing pending--no Pt can ambulate independently Pt denies use of chewing tobacco Discussed diabetic I weight loss medication holds Discussed NSAID holds Checked BMI Pt instructed to use Singlecare.com or GoodRx for a price reduction on prep  Patient's chart reviewed by Cathlyn Parsons CNRA prior to previsit and patient appropriate for the [facility].  Pre visit completed and red dot placed by patient's name on their procedure day (on provider's schedule).

## 2023-06-08 ENCOUNTER — Telehealth: Payer: Self-pay | Admitting: Internal Medicine

## 2023-06-08 NOTE — Telephone Encounter (Signed)
 Pt states that she was scheduled originally set for 3/4 but had to be rescheduled. She currently has the Suprep at home. Instructed pt that she does not have topick up the med at the pharmacy and she has everything she needs as far as prep. No othr questions at this time.

## 2023-06-08 NOTE — Telephone Encounter (Signed)
 Inbound call from patient stating that she received a call from [facility] that prep medication was ready for pick up and out of pocket cost is $64.00. patient stated she advised yesterday that she still have prep medication from 3/4 colonoscopy appointment. Please advise, thank you.

## 2023-06-11 ENCOUNTER — Other Ambulatory Visit: Payer: Self-pay | Admitting: Pulmonary Disease

## 2023-06-18 ENCOUNTER — Encounter: Payer: Self-pay | Admitting: Internal Medicine

## 2023-06-19 ENCOUNTER — Telehealth: Payer: Self-pay | Admitting: Internal Medicine

## 2023-06-19 NOTE — Telephone Encounter (Signed)
 Spoke with patient and answered all questions pertaining to prep.  Patient verbally understood all directions for the prep.

## 2023-06-19 NOTE — Telephone Encounter (Signed)
 Inbound call from patient stating she is concerned whether or not her prep is still good to use for 4/3 colonoscopy. Requesting a call back. Please advise, thank you.

## 2023-06-21 ENCOUNTER — Ambulatory Visit (AMBULATORY_SURGERY_CENTER): Payer: Medicare PPO | Admitting: Internal Medicine

## 2023-06-21 ENCOUNTER — Encounter: Payer: Self-pay | Admitting: Internal Medicine

## 2023-06-21 VITALS — BP 119/74 | HR 53 | Temp 97.5°F | Resp 16 | Ht 63.5 in | Wt 176.0 lb

## 2023-06-21 DIAGNOSIS — Z1211 Encounter for screening for malignant neoplasm of colon: Secondary | ICD-10-CM | POA: Diagnosis not present

## 2023-06-21 DIAGNOSIS — I10 Essential (primary) hypertension: Secondary | ICD-10-CM | POA: Diagnosis not present

## 2023-06-21 DIAGNOSIS — K5730 Diverticulosis of large intestine without perforation or abscess without bleeding: Secondary | ICD-10-CM | POA: Diagnosis not present

## 2023-06-21 DIAGNOSIS — K648 Other hemorrhoids: Secondary | ICD-10-CM

## 2023-06-21 DIAGNOSIS — E039 Hypothyroidism, unspecified: Secondary | ICD-10-CM | POA: Diagnosis not present

## 2023-06-21 MED ORDER — SODIUM CHLORIDE 0.9 % IV SOLN
500.0000 mL | INTRAVENOUS | Status: DC
Start: 2023-06-21 — End: 2023-06-21

## 2023-06-21 NOTE — Patient Instructions (Signed)

## 2023-06-21 NOTE — Progress Notes (Signed)
 HISTORY OF PRESENT ILLNESS:  Sara House is a 68 y.o. female sent for screening colonoscopy.  Previous exam 2007 was normal  REVIEW OF SYSTEMS:  All non-GI ROS negative except for  Past Medical History:  Diagnosis Date   Allergies    Allergy    Asthma    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 29th, 2023   Pneumonia 2022   covid pneumonia   Sinus congestion     Past Surgical History:  Procedure Laterality Date   ABDOMINAL HYSTERECTOMY     BREAST LUMPECTOMY WITH RADIOACTIVE SEED AND SENTINEL LYMPH NODE BIOPSY Left 12/14/2021   Procedure: LEFT BREAST LUMPECTOMY WITH RADIOACTIVE SEED AND SENTINEL LYMPH NODE BIOPSY;  Surgeon: Griselda Miner, MD;  [facility];  Service: General;  Laterality: Left;   CHOLECYSTECTOMY     DUPUYTREN CONTRACTURE RELEASE     GALLBLADDER SURGERY     LAPAROSCOPIC HYSTERECTOMY     left rotator cuff repair Left     Social History Sara House  reports that she has never smoked. She has never used smokeless tobacco. She reports that she does not drink alcohol and does not use drugs.  family history includes Breast cancer in her sister; CVA in her father and mother; Heart disease in her father; Heart failure in her mother; Hypertension in her mother; Stroke in her mother; Uterine cancer in her mother.  Allergies  Allergen Reactions   Alendronate Nausea And Vomiting   Cefdinir Other (See Comments)    Patient doesn't remember   Levofloxacin Swelling   Penicillins Itching   Doxycycline Rash       PHYSICAL EXAMINATION: Vital signs: BP 106/60   Pulse (!) 59   Temp (!) 97.5 F (36.4 C)   Ht 5' 3.5" (1.613 m)   Wt 176 lb (79.8 kg)   SpO2 99%   BMI 30.69 kg/m   General: Well-developed, well-nourished, no acute distress HEENT: Sclerae are anicteric, conjunctiva pink. Oral mucosa intact Lungs: Clear Heart: Regular Abdomen: soft, nontender, nondistended, no obvious ascites, no peritoneal signs, normal bowel sounds. No organomegaly. Extremities: No edema Psychiatric: alert and oriented x3. Cooperative     ASSESSMENT:  Colon cancer screening   PLAN: Screening colonoscopy

## 2023-06-21 NOTE — Progress Notes (Signed)
 Pt sedate, gd SR's, VSS, report to RN

## 2023-06-21 NOTE — Op Note (Signed)
 [facility] Patient Name: Sara House Procedure Date: 06/21/2023 9:57 AM MRN: [id number] Endoscopist: Wilhemina Bonito. Marina Goodell , MD, [id number] Age: 68 Referring MD:  Date of Birth: 23-Jul-1955 Gender: Female Account #: [id number] Procedure:                Colonoscopy Indications:              Screening for colorectal malignant neoplasm.                            Previous exam 2007 was normal Medicines:                Monitored Anesthesia Care Procedure:                Pre-Anesthesia Assessment:                           - Prior to the procedure, a History and Physical                            was performed, and patient medications and                            allergies were reviewed. The patient's tolerance of                            previous anesthesia was also reviewed. The risks                            and benefits of the procedure and the sedation                            options and risks were discussed with the patient.                            All questions were answered, and informed consent                            was obtained. Prior Anticoagulants: The patient has                            taken no anticoagulant or antiplatelet agents. ASA                            Grade Assessment: II - A patient with mild systemic                            disease. After reviewing the risks and benefits,                            the patient was deemed in satisfactory condition to                            undergo the procedure.  After obtaining informed consent, the colonoscope                            was passed under direct vision. Throughout the                            procedure, the patient's blood pressure, pulse, and                            oxygen saturations were monitored continuously. The                            CF [id number] #[id number] was introduced through the anus                            and advanced to the the cecum,  identified by                            appendiceal orifice and ileocecal valve. The                            ileocecal valve, appendiceal orifice, and rectum                            were photographed. The quality of the bowel                            preparation was excellent. The colonoscopy was                            performed without difficulty. The patient tolerated                            the procedure well. The bowel preparation used was                            SUPREP via split dose instruction. Scope In: 10:15:15 AM Scope Out: 10:26:29 AM Scope Withdrawal Time: 0 hours 10 minutes 13 seconds  Total Procedure Duration: 0 hours 11 minutes 14 seconds  Findings:                 Multiple diverticula were found in the sigmoid                            colon.                           Internal hemorrhoids were found during retroflexion.                           The exam was otherwise without abnormality on                            direct and retroflexion views. Complications:            No  immediate complications. Estimated blood loss:                            None. Estimated Blood Loss:     Estimated blood loss: none. Impression:               - Diverticulosis in the sigmoid colon.                           - Internal hemorrhoids.                           - The examination was otherwise normal on direct                            and retroflexion views.                           - No specimens collected. Recommendation:           - Repeat colonoscopy is not recommended for                            surveillance.                           - Patient has a contact number available for                            emergencies. The signs and symptoms of potential                            delayed complications were discussed with the                            patient. Return to normal activities tomorrow.                            Written discharge instructions  were provided to the                            patient.                           - Resume previous diet.                           - Continue present medications. Wilhemina Bonito. Marina Goodell, MD 06/21/2023 10:30:48 AM This report has been signed electronically.

## 2023-06-21 NOTE — Progress Notes (Signed)
 Pt's states no medical or surgical changes since previsit or office visit.

## 2023-06-22 ENCOUNTER — Telehealth: Payer: Self-pay

## 2023-06-22 NOTE — Telephone Encounter (Signed)
  Follow up Call-     06/21/2023    8:37 AM  Call back number  Post procedure Call Back phone  # [phone]  Permission to leave phone message Yes     Patient questions:  Do you have a fever, pain , or abdominal swelling? No. Pain Score  0 *  Have you tolerated food without any problems? Yes.    Have you been able to return to your normal activities? Yes.    Do you have any questions about your discharge instructions: Diet   No. Medications  No. Follow up visit  No.  Do you have questions or concerns about your Care? No.  Actions: * If pain score is 4 or above: No action needed, pain <4.

## 2023-07-12 ENCOUNTER — Other Ambulatory Visit: Payer: Self-pay | Admitting: Pulmonary Disease

## 2023-07-17 ENCOUNTER — Other Ambulatory Visit: Payer: Self-pay | Admitting: Pulmonary Disease

## 2023-07-30 ENCOUNTER — Ambulatory Visit: Payer: Medicare PPO | Attending: Adult Health

## 2023-07-30 VITALS — Wt 181.5 lb

## 2023-07-30 DIAGNOSIS — Z483 Aftercare following surgery for neoplasm: Secondary | ICD-10-CM | POA: Insufficient documentation

## 2023-07-30 NOTE — Therapy (Signed)
 OUTPATIENT PHYSICAL THERAPY SOZO SCREENING NOTE   Patient Name: Sara House MRN: [id number] DOB:[birth year], 68 y.o., female Today's Date: 07/30/2023  PCP: Margaret Sharp, PA-C REFERRING PROVIDER: Alwin Baars Cornett*   PT End of Session - 07/30/23 0900     Visit Number 7   # unchanged due to screen only   PT Start Time 0859    PT Stop Time 0903    PT Time Calculation (min) 4 min    Activity Tolerance Patient tolerated treatment well    Behavior During Therapy [facility] for tasks assessed/performed             Past Medical History:  Diagnosis Date   Allergies    Allergy    Asthma    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 29th, 2023   Pneumonia 2022   covid pneumonia   Sinus congestion    Past Surgical History:  Procedure Laterality Date   ABDOMINAL HYSTERECTOMY     BREAST LUMPECTOMY WITH RADIOACTIVE SEED AND SENTINEL LYMPH NODE BIOPSY Left 12/14/2021   Procedure: LEFT BREAST LUMPECTOMY WITH RADIOACTIVE SEED AND SENTINEL LYMPH NODE BIOPSY;  Surgeon: Caralyn Chandler, MD;  Location: [facility];  Service: General;  Laterality: Left;   CHOLECYSTECTOMY     DUPUYTREN CONTRACTURE RELEASE     GALLBLADDER SURGERY     LAPAROSCOPIC HYSTERECTOMY     left rotator cuff repair Left    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) 11/28/2021   Malignant neoplasm of upper-outer quadrant of left female breast (HCC) 11/28/2021    Asthma 12/30/2020   Allergic rhinitis 12/30/2020   Seasonal allergic rhinitis due to pollen 08/07/2017   Sensorineural hearing loss (SNHL), bilateral 08/06/2017   Hypothyroidism 05/19/2015   Hypertension 05/19/2015   Hypercholesterolemia 05/19/2015   ABDOMINAL PAIN 04/01/2009   GERD 03/09/2009   IRRITABLE BOWEL SYNDROME 03/09/2009   DYSPHAGIA UNSPECIFIED 03/09/2009   Gastro-esophageal reflux disease without esophagitis 03/09/2009    REFERRING DIAG: left breast cancer at risk for lymphedema  THERAPY DIAG: Aftercare following surgery for neoplasm  PERTINENT HISTORY: Had left lumpectomy and SLNB on 12/14/21 due to [facility] of the left breast. 2 lymph nodes removed. Hx of left RCR.  Completed radiation. On anastrozole . Has osteoporosis. Wearing compression bra and using the pad.  Fat necrosis noted in the breast which has been better taking naproxen  which makes it only a bit better.  Has a glove and a sleeve.   PRECAUTIONS: left UE Lymphedema risk, None  SUBJECTIVE: Pt returns for her 3 month L-Dex screen.   PAIN:  Are you having pain? No  SOZO SCREENING: Patient was assessed today using the SOZO machine to determine the lymphedema index score. This was compared to her baseline score. It was determined that she is within the recommended range when compared to her baseline and no further action is needed at this time. She will continue SOZO screenings. These are done  every 3 months for 2 years post operatively followed by every 6 months for 2 years, and then annually.   L-DEX FLOWSHEETS - 07/30/23 0900       L-DEX LYMPHEDEMA SCREENING   Measurement Type Unilateral    L-DEX MEASUREMENT EXTREMITY Upper Extremity    POSITION  Standing    DOMINANT SIDE Left    At Risk Side Left    BASELINE SCORE (UNILATERAL) -3.9    L-DEX SCORE (UNILATERAL) -8.6    VALUE CHANGE (UNILAT) -4.7             P: Transition to 6 month after next screen.   Denyce Flank, PTA 07/30/2023, 9:02  AM

## 2023-08-09 DIAGNOSIS — Z20822 Contact with and (suspected) exposure to covid-19: Secondary | ICD-10-CM | POA: Diagnosis not present

## 2023-08-09 DIAGNOSIS — J40 Bronchitis, not specified as acute or chronic: Secondary | ICD-10-CM | POA: Diagnosis not present

## 2023-09-19 ENCOUNTER — Other Ambulatory Visit (HOSPITAL_COMMUNITY): Payer: Self-pay | Admitting: Family Medicine

## 2023-09-19 DIAGNOSIS — R748 Abnormal levels of other serum enzymes: Secondary | ICD-10-CM

## 2023-09-20 NOTE — Progress Notes (Signed)
 Vitamin B12 levels remain normal.  Cholesterol levels look okay overall.  Triglycerides/carbohydrate form of cholesterol are mildly elevated, but we do not need to make any changes right now.  Liver enzymes remain elevated.  Please reiterate for her to schedule her right upper quadrant ultrasound I have ordered for [facility].  No infection/anemia.  Thyroid numbers look good.  Vitamin D levels are normal.  We do not need to make any changes.

## 2023-09-28 ENCOUNTER — Ambulatory Visit (HOSPITAL_COMMUNITY)
Admission: RE | Admit: 2023-09-28 | Discharge: 2023-09-28 | Disposition: A | Discharge status: Home / Self Care | Source: Ambulatory Visit | Attending: Family Medicine | Admitting: Family Medicine

## 2023-09-28 DIAGNOSIS — R748 Abnormal levels of other serum enzymes: Secondary | ICD-10-CM | POA: Insufficient documentation

## 2023-10-01 ENCOUNTER — Other Ambulatory Visit: Payer: Self-pay | Admitting: Adult Health

## 2023-10-01 DIAGNOSIS — Z853 Personal history of malignant neoplasm of breast: Secondary | ICD-10-CM

## 2023-10-29 ENCOUNTER — Ambulatory Visit: Attending: Adult Health

## 2023-10-29 VITALS — Wt 182.5 lb

## 2023-10-29 DIAGNOSIS — Z483 Aftercare following surgery for neoplasm: Secondary | ICD-10-CM | POA: Insufficient documentation

## 2023-10-29 NOTE — Therapy (Signed)
 OUTPATIENT PHYSICAL THERAPY SOZO SCREENING NOTE   Patient Name: Sara House MRN: [id number] DOB:01-Dec-1955, 68 y.o., female Today's Date: 10/29/2023  PCP: Lynwood Laneta ORN, PA-C REFERRING PROVIDER: Crawford Jacobsen Cornett*   PT End of Session - 10/29/23 [phone]     Visit Number 7   # unchanged due to screen only   PT Start Time 0956    PT Stop Time 1000    PT Time Calculation (min) 4 min    Activity Tolerance Patient tolerated treatment well    Behavior During Therapy [facility] for tasks assessed/performed          Past Medical History:  Diagnosis Date   Allergies    Allergy    Asthma    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 29th, 2023   Pneumonia 2022   covid pneumonia   Sinus congestion    Past Surgical History:  Procedure Laterality Date   ABDOMINAL HYSTERECTOMY     BREAST LUMPECTOMY WITH RADIOACTIVE SEED AND SENTINEL LYMPH NODE BIOPSY Left 12/14/2021   Procedure: LEFT BREAST LUMPECTOMY WITH RADIOACTIVE SEED AND SENTINEL LYMPH NODE BIOPSY;  Surgeon: Curvin Deward MOULD, MD;  Location: [facility];  Service: General;  Laterality: Left;   CHOLECYSTECTOMY     DUPUYTREN CONTRACTURE RELEASE     GALLBLADDER SURGERY     LAPAROSCOPIC HYSTERECTOMY     left rotator cuff repair Left    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) 11/28/2021   Malignant neoplasm of upper-outer quadrant of left female breast (HCC) 11/28/2021   Asthma  12/30/2020   Allergic rhinitis 12/30/2020   Seasonal allergic rhinitis due to pollen 08/07/2017   Sensorineural hearing loss (SNHL), bilateral 08/06/2017   Hypothyroidism 05/19/2015   Hypertension 05/19/2015   Hypercholesterolemia 05/19/2015   ABDOMINAL PAIN 04/01/2009   GERD 03/09/2009   IRRITABLE BOWEL SYNDROME 03/09/2009   DYSPHAGIA UNSPECIFIED 03/09/2009   Gastro-esophageal reflux disease without esophagitis 03/09/2009    REFERRING DIAG: left breast cancer at risk for lymphedema  THERAPY DIAG: Aftercare following surgery for neoplasm  PERTINENT HISTORY: Had left lumpectomy and SLNB on 12/14/21 due to [facility] of the left breast. 2 lymph nodes removed. Hx of left RCR.  Completed radiation. On anastrozole . Has osteoporosis. Wearing compression bra and using the pad.  Fat necrosis noted in the breast which has been better taking naproxen  which makes it only a bit better.  Has a glove and a sleeve.   PRECAUTIONS: left UE Lymphedema risk, None  SUBJECTIVE: Pt returns for her last 3 month L-Dex screen.   PAIN:  Are you having pain? No  SOZO SCREENING: Patient was assessed today using the SOZO machine to determine the lymphedema index score. This was compared to her baseline score. It was determined that she is within the recommended range when compared to her baseline and no further action is needed at this time. She will continue SOZO screenings. These are done every 3  months for 2 years post operatively followed by every 6 months for 2 years, and then annually.   L-DEX FLOWSHEETS - 10/29/23 0900       L-DEX LYMPHEDEMA SCREENING   Measurement Type Unilateral    L-DEX MEASUREMENT EXTREMITY Upper Extremity    POSITION  Standing    DOMINANT SIDE Left    At Risk Side Left    BASELINE SCORE (UNILATERAL) -3.9    L-DEX SCORE (UNILATERAL) -4.1    VALUE CHANGE (UNILAT) -0.2          P: Begin 6 month screens.   Aden Berwyn Caldron, PTA 10/29/2023, 9:59 AM

## 2023-11-08 ENCOUNTER — Ambulatory Visit
Admission: RE | Admit: 2023-11-08 | Discharge: 2023-11-08 | Disposition: A | Discharge status: Home / Self Care | Source: Ambulatory Visit | Attending: Adult Health | Admitting: Adult Health

## 2023-11-08 ENCOUNTER — Other Ambulatory Visit: Payer: Self-pay | Admitting: Adult Health

## 2023-11-08 DIAGNOSIS — Z853 Personal history of malignant neoplasm of breast: Secondary | ICD-10-CM

## 2023-11-08 DIAGNOSIS — N6489 Other specified disorders of breast: Secondary | ICD-10-CM

## 2024-02-07 ENCOUNTER — Inpatient Hospital Stay: Payer: Medicare PPO | Admitting: Adult Health

## 2024-02-16 ENCOUNTER — Other Ambulatory Visit: Payer: Self-pay | Admitting: Adult Health

## 2024-03-25 ENCOUNTER — Encounter: Payer: Self-pay | Admitting: Adult Health

## 2024-03-25 ENCOUNTER — Inpatient Hospital Stay: Attending: Adult Health | Admitting: Adult Health

## 2024-03-25 VITALS — BP 149/66 | HR 61 | Temp 97.7°F | Resp 18 | Wt 187.6 lb

## 2024-03-25 DIAGNOSIS — Z803 Family history of malignant neoplasm of breast: Secondary | ICD-10-CM | POA: Insufficient documentation

## 2024-03-25 DIAGNOSIS — Z79899 Other long term (current) drug therapy: Secondary | ICD-10-CM | POA: Insufficient documentation

## 2024-03-25 DIAGNOSIS — Z8049 Family history of malignant neoplasm of other genital organs: Secondary | ICD-10-CM | POA: Insufficient documentation

## 2024-03-25 DIAGNOSIS — Z1721 Progesterone receptor positive status: Secondary | ICD-10-CM | POA: Insufficient documentation

## 2024-03-25 DIAGNOSIS — Z170 Estrogen receptor positive status [ER+]: Secondary | ICD-10-CM | POA: Insufficient documentation

## 2024-03-25 DIAGNOSIS — N644 Mastodynia: Secondary | ICD-10-CM | POA: Diagnosis not present

## 2024-03-25 DIAGNOSIS — N6489 Other specified disorders of breast: Secondary | ICD-10-CM | POA: Diagnosis not present

## 2024-03-25 DIAGNOSIS — N6012 Diffuse cystic mastopathy of left breast: Secondary | ICD-10-CM | POA: Diagnosis not present

## 2024-03-25 DIAGNOSIS — J4530 Mild persistent asthma, uncomplicated: Secondary | ICD-10-CM | POA: Insufficient documentation

## 2024-03-25 DIAGNOSIS — N6082 Other benign mammary dysplasias of left breast: Secondary | ICD-10-CM | POA: Diagnosis not present

## 2024-03-25 DIAGNOSIS — M816 Localized osteoporosis [Lequesne]: Secondary | ICD-10-CM | POA: Insufficient documentation

## 2024-03-25 DIAGNOSIS — Z9049 Acquired absence of other specified parts of digestive tract: Secondary | ICD-10-CM | POA: Diagnosis not present

## 2024-03-25 DIAGNOSIS — Z823 Family history of stroke: Secondary | ICD-10-CM | POA: Diagnosis not present

## 2024-03-25 DIAGNOSIS — K760 Fatty (change of) liver, not elsewhere classified: Secondary | ICD-10-CM | POA: Diagnosis not present

## 2024-03-25 DIAGNOSIS — Z8249 Family history of ischemic heart disease and other diseases of the circulatory system: Secondary | ICD-10-CM | POA: Diagnosis not present

## 2024-03-25 DIAGNOSIS — Z79811 Long term (current) use of aromatase inhibitors: Secondary | ICD-10-CM | POA: Insufficient documentation

## 2024-03-25 DIAGNOSIS — E782 Mixed hyperlipidemia: Secondary | ICD-10-CM | POA: Insufficient documentation

## 2024-03-25 DIAGNOSIS — Z1732 Human epidermal growth factor receptor 2 negative status: Secondary | ICD-10-CM | POA: Diagnosis not present

## 2024-03-25 DIAGNOSIS — C50412 Malignant neoplasm of upper-outer quadrant of left female breast: Secondary | ICD-10-CM | POA: Diagnosis not present

## 2024-03-25 DIAGNOSIS — Z90710 Acquired absence of both cervix and uterus: Secondary | ICD-10-CM | POA: Insufficient documentation

## 2024-03-25 NOTE — Progress Notes (Signed)
 [facility] Cancer Follow up:    Lynwood Laneta ORN, PA-C [address] 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  No suspicious findings in the right breast.  Diagnostic mammogram of the left confirmed irregular mixed echogenicity mass with dense posterior caustic shadowing in the 2:00 location of the left breast 6 cm from the nipple measuring about 0.3 x 0.6 x 0.4 cm.  Left axilla is negative for adenopathy   11/22/2021 Pathology Results   Left breast needle core biopsy showed invasive ductal carcinoma, grade 1 along with focal DCIS, no evidence of lymphovascular invasion.  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)   11/28/2021 Cancer Staging   Staging form: Breast, AJCC 8th Edition - Clinical stage from 11/28/2021: Stage IA (cT1b, cN0, cM0, G1, ER+, PR+, HER2-) - Signed by Lanell Donald Stagger, PA-C on 11/29/2021 Stage prefix: Initial diagnosis Method of lymph node assessment: Clinical Histologic grading system: 3 grade system   12/14/2021 Definitive Surgery   She is now status post left breast lumpectomy which showed invasive ductal carcinoma, grade 1, 1.2 cm in greatest  dimension, DCIS, solid and cribriform type, grade 2, calcifications associated with invasive carcinoma, negative for lymphovascular invasion.  1 lymph node negative for malignancy.  Left breast margin excision showed benign breast tissue with incidental small intraductal papilloma, usual ductal hyperplasia, fibrocystic change and apocrine metaplasia.  Negative for malignancy.  Left axillary lymph nodes negative for malignancy.  Previous prognostics showed ER 100% strong PR 85% strong HER2 -0, Ki-67 of 5%   12/14/2021 Oncotype testing   Oncotype DX of 10, distant recurrence risk at 9 years of 3% and group average absolute chemotherapy benefit in this age group less than 1%.   01/17/2022 - 02/15/2022 Radiation Therapy   Site Technique Total Dose (Gy) Dose per Fx (Gy) Completed Fx Beam Energies  Breast, Left: Breast_L 3D 42.56/42.56 2.66 16/16 10XFFF  Breast, Left: Breast_L_Bst             02/2022 -  Anti-estrogen oral therapy   Anastrozole       CURRENT THERAPY: anastrozole   INTERVAL HISTORY:  Discussed the use of AI scribe software for clinical note transcription with the patient, who gave verbal consent to proceed.  History of Present Illness Sara House is a 69 year old female with estrogen receptor positive left breast cancer, status post lumpectomy and adjuvant radiation, who presents for evaluation of persistent left breast pain.  She was diagnosed with left-sided breast cancer in 2023 and underwent lumpectomy followed by adjuvant radiation. She takes nightly anastrozole  with a planned five-year course. Her most recent mammogram in August 2025 showed no evidence of malignancy, with a probably benign medial left breast asymmetry without sonographic correlate and breast density  category B.  She has persistent left breast pain localized along the surgical incision, described as constant soreness and a sensation like being struck. Pain is worsened by pressure from lying on the left  side or bra contact and improved by activity and use of a pillow. She cannot rest her left breast on the mattress and finds some bras uncomfortable. She was told this may relate to radiation tissue damage and fat necrosis. She previously benefited from physical therapy and is scheduled to resume it in February. She stopped gabapentin  due to nightmares and previously used Naprosyn . She avoids acetaminophen  because of fatty liver disease and is discouraged by the ongoing discomfort since surgery.  She has osteoporosis diagnosed by bone density testing in January 2020 and takes calcium. She could not tolerate Fosamax because of gastrointestinal side effects and is due for repeat bone density testing. She remains physically active with aerobic exercise classes three times weekly and follows a diet high in fruits and vegetables.     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) 11/28/2021   Malignant neoplasm of upper-outer quadrant of left female breast (HCC) 11/28/2021   Asthma 12/30/2020   Allergic rhinitis 12/30/2020   Seasonal allergic rhinitis due to pollen 08/07/2017   Sensorineural hearing loss (SNHL), bilateral 08/06/2017   Hypothyroidism 05/19/2015   Hypertension 05/19/2015   Hypercholesterolemia 05/19/2015   ABDOMINAL PAIN 04/01/2009   GERD 03/09/2009   IRRITABLE BOWEL SYNDROME 03/09/2009   DYSPHAGIA UNSPECIFIED 03/09/2009   Gastro-esophageal reflux disease without esophagitis 03/09/2009    is allergic to alendronate, cefdinir, levofloxacin, penicillins, and doxycycline.  MEDICAL HISTORY: Past Medical History:  Diagnosis Date   Allergies    Allergy    Asthma    Breast cancer (HCC) 11/22/2021    right breast IDC   Cataract    GERD (gastroesophageal reflux disease)    History of COVID-19    HLD (hyperlipidemia)    HTN (hypertension)    Hypothyroidism    Osteoporosis    Personal history of radiation therapy 01/17/2022   20 sessions - ended Nov. 29th, 2023   Pneumonia 2022   covid pneumonia   Sinus congestion     SURGICAL HISTORY: Past Surgical History:  Procedure Laterality Date   ABDOMINAL HYSTERECTOMY     BREAST LUMPECTOMY WITH RADIOACTIVE SEED AND SENTINEL LYMPH NODE BIOPSY Left 12/14/2021   Procedure: LEFT BREAST LUMPECTOMY WITH RADIOACTIVE SEED AND SENTINEL LYMPH NODE BIOPSY;  Surgeon: Curvin Deward MOULD, MD;  Location: [facility];  Service: General;  Laterality: Left;   CHOLECYSTECTOMY     DUPUYTREN CONTRACTURE RELEASE     GALLBLADDER SURGERY     LAPAROSCOPIC HYSTERECTOMY     left rotator cuff repair Left     SOCIAL HISTORY: Social History   Socioeconomic History   Marital status: Married    Spouse name: Not on file   Number of children: Not on file   Years of education: Not on file   Highest education level: Not on file  Occupational History   Not on file  Tobacco Use   Smoking status: Never   Smokeless tobacco: Never  Substance and Sexual Activity   Alcohol use: Never   Drug use: Never   Sexual activity: Yes  Birth control/protection: Surgical    Comment: hyst  Other Topics Concern   Not on file  Social History Narrative   Not on file   Social Drivers of Health   Tobacco Use: Low Risk (03/25/2024)   Patient History    Smoking Tobacco Use: Never    Smokeless Tobacco Use: Never    Passive Exposure: Not on file  Financial Resource Strain: Low Risk (03/25/2024)   Overall Financial Resource Strain (CARDIA)    Difficulty of Paying Living Expenses: Not hard at all  Food Insecurity: No Food Insecurity (03/25/2024)   Epic    Worried About Radiation Protection Practitioner of Food in the Last Year: Never true    Ran Out of Food in the Last Year: Never true   Transportation Needs: No Transportation Needs (03/25/2024)   Epic    Lack of Transportation (Medical): No    Lack of Transportation (Non-Medical): No  Physical Activity: Insufficiently Active (03/25/2024)   Exercise Vital Sign    Days of Exercise per Week: 7 days    Minutes of Exercise per Session: 10 min  Stress: No Stress Concern Present (03/25/2024)   [employer] of Occupational Health - Occupational Stress Questionnaire    Feeling of Stress: Only a little  Social Connections: Socially Integrated (03/25/2024)   Social Connection and Isolation Panel    Frequency of Communication with Friends and Family: More than three times a week    Frequency of Social Gatherings with Friends and Family: Three times a week    Attends Religious Services: More than 4 times per year    Active Member of Clubs or Organizations: Yes    Attends Banker Meetings: 1 to 4 times per year    Marital Status: Married  Catering Manager Violence: Not At Risk (03/25/2024)   Epic    Fear of Current or Ex-Partner: No    Emotionally Abused: No    Physically Abused: No    Sexually Abused: No  Depression (PHQ2-9): Low Risk (03/25/2024)   Depression (PHQ2-9)    PHQ-2 Score: 0  Alcohol Screen: Low Risk (03/25/2024)   Alcohol Screen    Last Alcohol Screening Score (AUDIT): 0  Housing: Unknown (03/25/2024)   Epic    Unable to Pay for Housing in the Last Year: No    Number of Times Moved in the Last Year: Not on file    Homeless in the Last Year: No  Utilities: Not At Risk (03/25/2024)   Epic    Threatened with loss of utilities: No  Health Literacy: Adequate Health Literacy (03/25/2024)   B1300 Health Literacy    Frequency of need for help with medical instructions: Never    FAMILY HISTORY: Family History  Problem Relation Age of Onset   CVA Mother    Stroke Mother    Heart failure Mother    Hypertension Mother    Uterine cancer Mother    CVA Father    Heart disease Father    Breast cancer Sister         unknown age   Colon cancer Neg Hx    Colon polyps Neg Hx    Esophageal cancer Neg Hx    Rectal cancer Neg Hx    Stomach cancer Neg Hx     Review of Systems  Constitutional:  Negative for appetite change, chills, fatigue, fever and unexpected weight change.  HENT:   Negative for hearing loss, lump/mass and trouble swallowing.   Eyes:  Negative for eye problems and  icterus.  Respiratory:  Negative for chest tightness, cough and shortness of breath.   Cardiovascular:  Negative for chest pain, leg swelling and palpitations.  Gastrointestinal:  Negative for abdominal distention, abdominal pain, constipation, diarrhea, nausea and vomiting.  Endocrine: Negative for hot flashes.  Genitourinary:  Negative for difficulty urinating.   Musculoskeletal:  Negative for arthralgias.  Skin:  Negative for itching and rash.  Neurological:  Negative for dizziness, extremity weakness, headaches and numbness.  Hematological:  Negative for adenopathy. Does not bruise/bleed easily.  Psychiatric/Behavioral:  Negative for depression. The patient is not nervous/anxious.       PHYSICAL EXAMINATION   Onc Performance Status - 03/25/24 1052       ECOG Perf Status   ECOG Perf Status Restricted in physically strenuous activity but ambulatory and able to carry out work of a light or sedentary nature, e.g., light house work, office work      KPS SCALE   KPS % SCORE Able to carry on normal activity, minor s/s of disease          Vitals:   03/25/24 1040  BP: (!) 149/66  Pulse: 61  Resp: 18  Temp: 97.7 F (36.5 C)  SpO2: 98%    Physical Exam Constitutional:      General: She is not in acute distress.    Appearance: Normal appearance. She is not toxic-appearing.  HENT:     Head: Normocephalic and atraumatic.     Mouth/Throat:     Mouth: Mucous membranes are moist.     Pharynx: Oropharynx is clear. No oropharyngeal exudate or posterior oropharyngeal erythema.  Eyes:     General: No scleral  icterus. Cardiovascular:     Rate and Rhythm: Normal rate and regular rhythm.     Pulses: Normal pulses.     Heart sounds: Normal heart sounds.  Pulmonary:     Effort: Pulmonary effort is normal.     Breath sounds: Normal breath sounds.  Chest:     Comments: Left breast s/p lumpectomy and radiation, no sign of local recurrence; right breast benign Abdominal:     General: Abdomen is flat. Bowel sounds are normal. There is no distension.     Palpations: Abdomen is soft.     Tenderness: There is no abdominal tenderness.  Musculoskeletal:        General: No swelling.     Cervical back: Neck supple.  Lymphadenopathy:     Cervical: No cervical adenopathy.     Upper Body:     Right upper body: No supraclavicular or axillary adenopathy.     Left upper body: No supraclavicular or axillary adenopathy.  Skin:    General: Skin is warm and dry.     Findings: No rash.  Neurological:     General: No focal deficit present.     Mental Status: She is alert.  Psychiatric:        Mood and Affect: Mood normal.        Behavior: Behavior normal.      ASSESSMENT and THERAPY PLAN:    Assessment and Plan Assessment & Plan Estrogen receptor positive left breast cancer, post-treatment Post-lumpectomy and radiation, on anastrozole  with no recurrence. Asymmetry under surveillance. Tolerates anastrozole  well. - Continued anastrozole  therapy. - Annual oncology follow-up. - Discussed optional Guardant Reveal circulating tumor DNA testing; she will let me know if she wants to proceed with testing - Scheduled follow-up diagnostic left breast mammogram in February.  Fat necrosis of left breast Chronic pain and  nodularity at lumpectomy site, consistent with fat necrosis. No malignancy. Physical therapy beneficial. - Return to physical therapy for massage and exercise. - Use over-the-counter Voltaren (diclofenac) gel. - Continue compression garments as tolerated. - Reassured benign nature; surgery not  indicated.  Localized osteoporosis Osteoporosis on bone density testing, managed with calcium. Bisphosphonates not tolerated. Increased risk due to age and anastrozole . - Ordered repeat bone density (DEXA) scan. - Continue calcium supplementation. - Encouraged weight-bearing exercise and diet rich in fruits and vegetables. - Discussed osteoporosis risk with anastrozole  and importance of monitoring.   RTC in 1 year for follow-up.     All questions were answered. The patient knows to call the clinic with any problems, questions or concerns. We can certainly see the patient much sooner if necessary.  Total encounter time:30 minutes*in face-to-face visit time, chart review, lab review, care coordination, order entry, and documentation of the encounter time.    Morna Kendall, NP 03/25/2024 11:03 AM Medical Oncology and Hematology [facility] [address] [place], [place] [address] Tel. [phone]    Fax. [phone]  *Total Encounter Time as defined by the Centers for Medicare and Medicaid Services includes, in addition to the face-to-face time of a patient visit (documented in the note above) non-face-to-face time: obtaining and reviewing outside history, ordering and reviewing medications, tests or procedures, care coordination (communications with other health care professionals or caregivers) and documentation in the medical record.

## 2024-04-14 ENCOUNTER — Other Ambulatory Visit (HOSPITAL_COMMUNITY)

## 2024-04-25 ENCOUNTER — Ambulatory Visit (HOSPITAL_COMMUNITY): Admission: RE | Admit: 2024-04-25 | Source: Ambulatory Visit

## 2024-04-25 DIAGNOSIS — M816 Localized osteoporosis [Lequesne]: Secondary | ICD-10-CM

## 2024-04-28 ENCOUNTER — Ambulatory Visit: Attending: Adult Health

## 2024-05-12 ENCOUNTER — Encounter

## 2025-03-25 ENCOUNTER — Inpatient Hospital Stay: Admitting: Adult Health

## 2025-03-26 ENCOUNTER — Inpatient Hospital Stay: Admitting: Hematology and Oncology
